# Patient Record
Sex: Male | Born: 1960
Health system: Southern US, Community
[De-identification: ages and names within clinical notes are randomized; demographics above are authoritative.]

## PROBLEM LIST (undated history)

## (undated) DIAGNOSIS — G4730 Sleep apnea, unspecified: Secondary | ICD-10-CM

## (undated) DIAGNOSIS — I2510 Atherosclerotic heart disease of native coronary artery without angina pectoris: Secondary | ICD-10-CM

## (undated) DIAGNOSIS — G2581 Restless legs syndrome: Secondary | ICD-10-CM

## (undated) DIAGNOSIS — K219 Gastro-esophageal reflux disease without esophagitis: Secondary | ICD-10-CM

## (undated) DIAGNOSIS — I255 Ischemic cardiomyopathy: Secondary | ICD-10-CM

## (undated) DIAGNOSIS — I219 Acute myocardial infarction, unspecified: Secondary | ICD-10-CM

## (undated) DIAGNOSIS — Z9289 Personal history of other medical treatment: Secondary | ICD-10-CM

## (undated) DIAGNOSIS — J9811 Atelectasis: Secondary | ICD-10-CM

## (undated) DIAGNOSIS — I10 Essential (primary) hypertension: Secondary | ICD-10-CM

## (undated) DIAGNOSIS — E7800 Pure hypercholesterolemia, unspecified: Secondary | ICD-10-CM

## (undated) DIAGNOSIS — N200 Calculus of kidney: Secondary | ICD-10-CM

## (undated) HISTORY — PX: CYSTOSCOPY: SUR368

## (undated) HISTORY — DX: Ischemic cardiomyopathy: I25.5

## (undated) HISTORY — DX: Personal history of other medical treatment: Z92.89

## (undated) HISTORY — PX: CHOLECYSTECTOMY: SHX55

---

## 1989-07-14 HISTORY — PX: CHEST TUBE INSERTION: SHX231

## 2001-01-08 ENCOUNTER — Encounter: Payer: Self-pay | Admitting: Thoracic Surgery

## 2001-01-08 ENCOUNTER — Encounter: Admission: RE | Admit: 2001-01-08 | Discharge: 2001-01-08 | Payer: Self-pay | Admitting: Thoracic Surgery

## 2001-01-12 ENCOUNTER — Ambulatory Visit (HOSPITAL_COMMUNITY): Admission: RE | Admit: 2001-01-12 | Discharge: 2001-01-12 | Payer: Self-pay | Admitting: Internal Medicine

## 2001-02-01 ENCOUNTER — Encounter: Payer: Self-pay | Admitting: Internal Medicine

## 2001-02-01 ENCOUNTER — Ambulatory Visit (HOSPITAL_COMMUNITY): Admission: RE | Admit: 2001-02-01 | Discharge: 2001-02-01 | Payer: Self-pay | Admitting: Internal Medicine

## 2001-10-23 ENCOUNTER — Emergency Department (HOSPITAL_COMMUNITY): Admission: EM | Admit: 2001-10-23 | Discharge: 2001-10-23 | Payer: Self-pay | Admitting: Emergency Medicine

## 2001-12-15 ENCOUNTER — Encounter: Payer: Self-pay | Admitting: Internal Medicine

## 2001-12-15 ENCOUNTER — Ambulatory Visit (HOSPITAL_COMMUNITY): Admission: RE | Admit: 2001-12-15 | Discharge: 2001-12-15 | Payer: Self-pay | Admitting: Internal Medicine

## 2001-12-30 ENCOUNTER — Encounter: Payer: Self-pay | Admitting: Internal Medicine

## 2001-12-30 ENCOUNTER — Ambulatory Visit (HOSPITAL_COMMUNITY): Admission: RE | Admit: 2001-12-30 | Discharge: 2001-12-30 | Payer: Self-pay | Admitting: Internal Medicine

## 2002-01-25 ENCOUNTER — Encounter: Payer: Self-pay | Admitting: Urology

## 2002-01-25 ENCOUNTER — Ambulatory Visit (HOSPITAL_COMMUNITY): Admission: RE | Admit: 2002-01-25 | Discharge: 2002-01-25 | Payer: Self-pay | Admitting: Urology

## 2002-03-21 ENCOUNTER — Ambulatory Visit (HOSPITAL_COMMUNITY): Admission: RE | Admit: 2002-03-21 | Discharge: 2002-03-21 | Payer: Self-pay | Admitting: Internal Medicine

## 2002-03-21 ENCOUNTER — Encounter: Payer: Self-pay | Admitting: Internal Medicine

## 2002-04-15 ENCOUNTER — Ambulatory Visit (HOSPITAL_COMMUNITY): Admission: RE | Admit: 2002-04-15 | Discharge: 2002-04-15 | Payer: Self-pay | Admitting: Internal Medicine

## 2002-07-14 DIAGNOSIS — I219 Acute myocardial infarction, unspecified: Secondary | ICD-10-CM

## 2002-07-14 HISTORY — PX: CARDIAC CATHETERIZATION: SHX172

## 2002-07-14 HISTORY — DX: Acute myocardial infarction, unspecified: I21.9

## 2002-09-04 ENCOUNTER — Encounter: Payer: Self-pay | Admitting: Internal Medicine

## 2002-09-04 ENCOUNTER — Encounter: Payer: Self-pay | Admitting: *Deleted

## 2002-09-04 ENCOUNTER — Inpatient Hospital Stay (HOSPITAL_COMMUNITY): Admission: AD | Admit: 2002-09-04 | Discharge: 2002-09-07 | Payer: Self-pay | Admitting: *Deleted

## 2002-09-05 ENCOUNTER — Encounter: Payer: Self-pay | Admitting: *Deleted

## 2003-03-10 ENCOUNTER — Ambulatory Visit (HOSPITAL_COMMUNITY): Admission: RE | Admit: 2003-03-10 | Discharge: 2003-03-10 | Payer: Self-pay | Admitting: Internal Medicine

## 2003-03-10 ENCOUNTER — Encounter: Payer: Self-pay | Admitting: Internal Medicine

## 2004-08-05 ENCOUNTER — Ambulatory Visit (HOSPITAL_COMMUNITY): Admission: RE | Admit: 2004-08-05 | Discharge: 2004-08-05 | Payer: Self-pay | Admitting: Internal Medicine

## 2006-10-13 ENCOUNTER — Encounter (INDEPENDENT_AMBULATORY_CARE_PROVIDER_SITE_OTHER): Payer: Self-pay | Admitting: Specialist

## 2006-10-13 ENCOUNTER — Ambulatory Visit: Payer: Self-pay | Admitting: Internal Medicine

## 2006-10-13 ENCOUNTER — Inpatient Hospital Stay (HOSPITAL_COMMUNITY): Admission: EM | Admit: 2006-10-13 | Discharge: 2006-10-13 | Payer: Self-pay | Admitting: Emergency Medicine

## 2006-12-11 ENCOUNTER — Ambulatory Visit: Payer: Self-pay | Admitting: Internal Medicine

## 2007-06-22 ENCOUNTER — Ambulatory Visit: Payer: Self-pay | Admitting: Internal Medicine

## 2007-09-12 DIAGNOSIS — Z9289 Personal history of other medical treatment: Secondary | ICD-10-CM

## 2007-09-12 HISTORY — DX: Personal history of other medical treatment: Z92.89

## 2008-03-06 IMAGING — CT CT PELVIS W/ CM
2 of 5 series · 15 of 46 positions shown, 17 images · IV contrast (Omnipaque 300)
Comparison: Noncontrast study performed on 01/25/02.

CLINICAL DATA: Abdominal and pelvic pain.  History of cholecystectomy and renal calculi. 
ABDOMEN CT WITH CONTRAST:
TECHNIQUE: Multidetector CT imaging of the abdomen was performed following the standard protocol during bolus administration of intravenous contrast.
Contrast:  Oral contrast and 100 cc Omnipaque 300
TECHNIQUE: Multidetector CT imaging of the pelvis was performed following the standard protocol during bolus administration of intravenous contrast.

[Series 3: mpr coronal a/p · coronal · 0.61mm/px · 3 of 73 slices shown]
[im 25/73  soft-tissue]
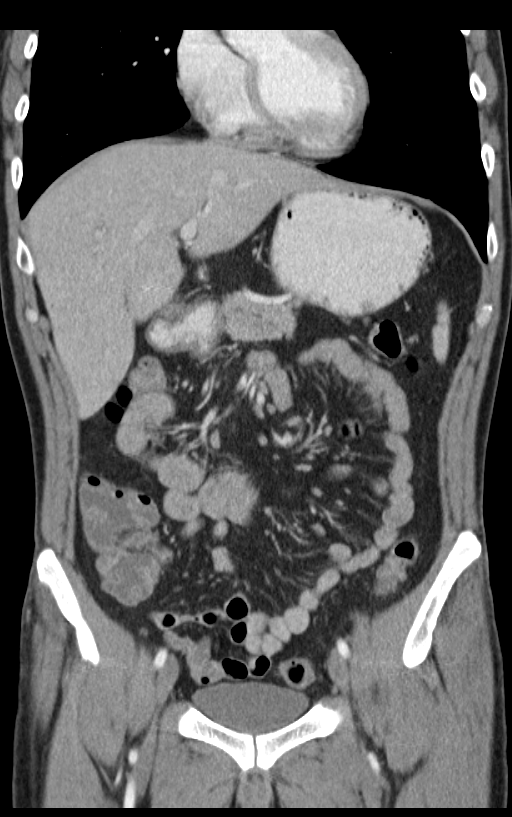
[im 33/73  soft-tissue]
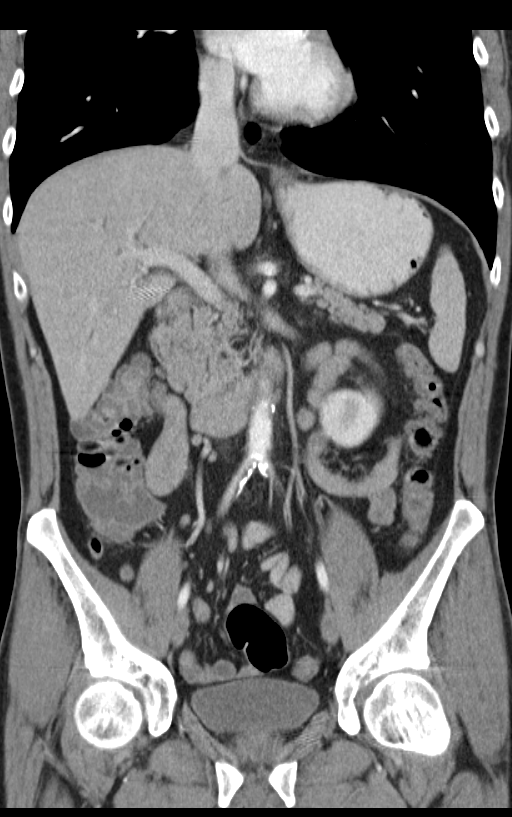
[im 41/73  soft-tissue]
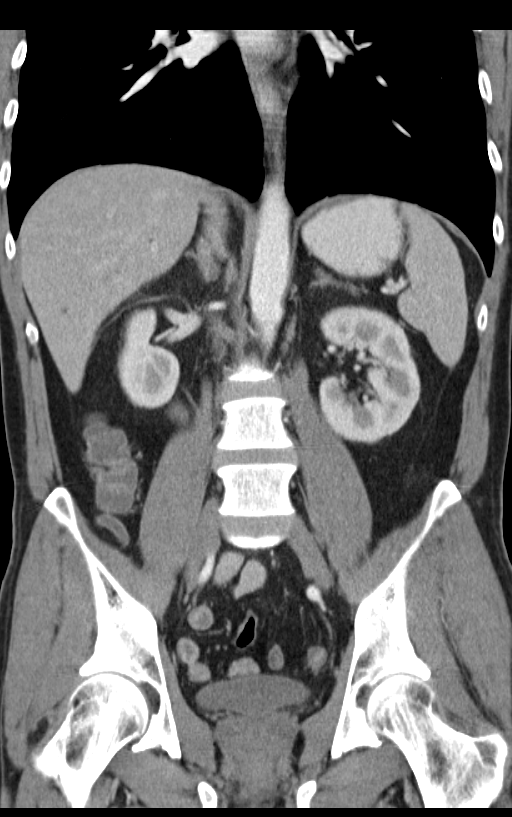

[Series 5: abd_pel 5.0 b40f · axial · 0.64mm/px · z∈[-531,-111]mm · 12 of 96 slices shown, 14 images]
[im 6/96  soft-tissue]
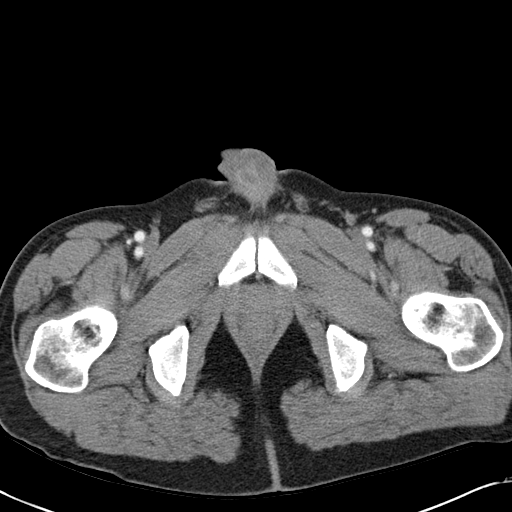
[im 6/96  bone]
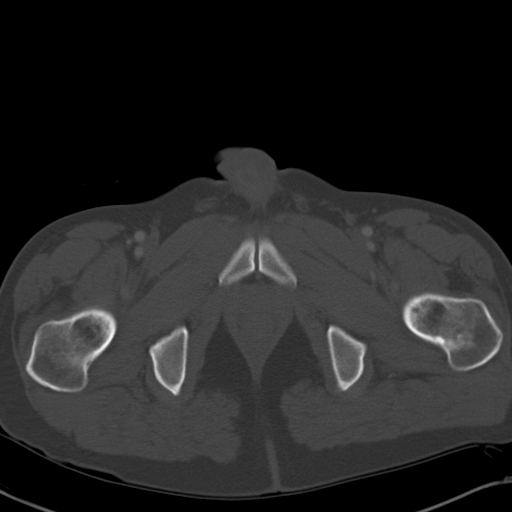
[im 17/96  soft-tissue]
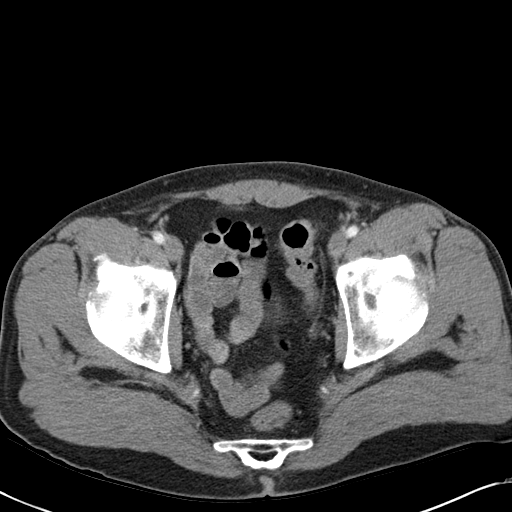
[im 23/96  soft-tissue]
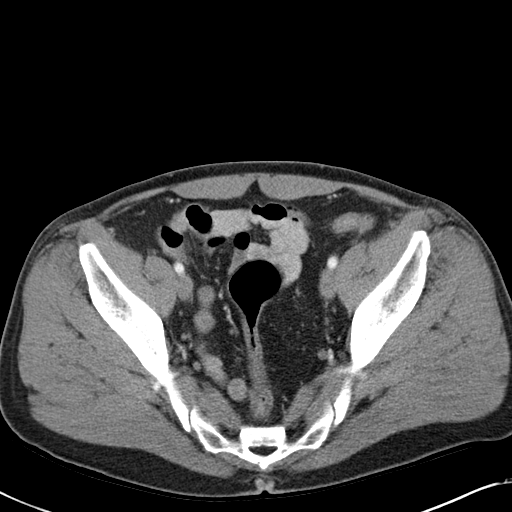
[im 28/96  soft-tissue]
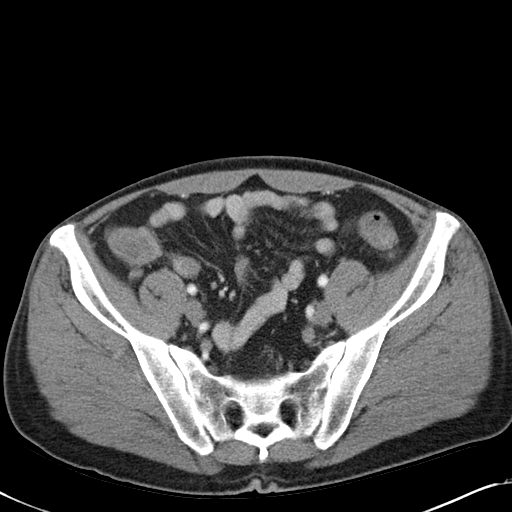
[im 40/96  soft-tissue]
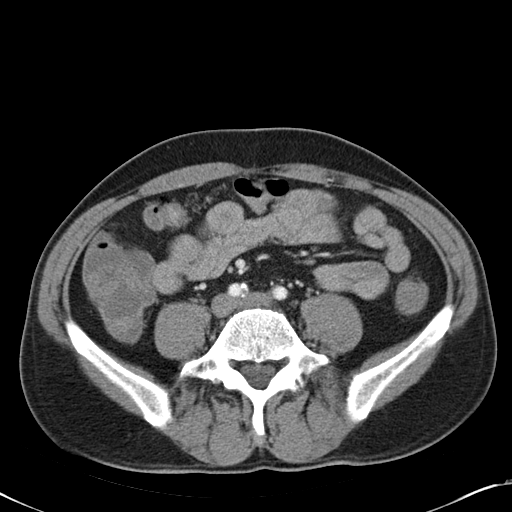
[im 45/96  soft-tissue]
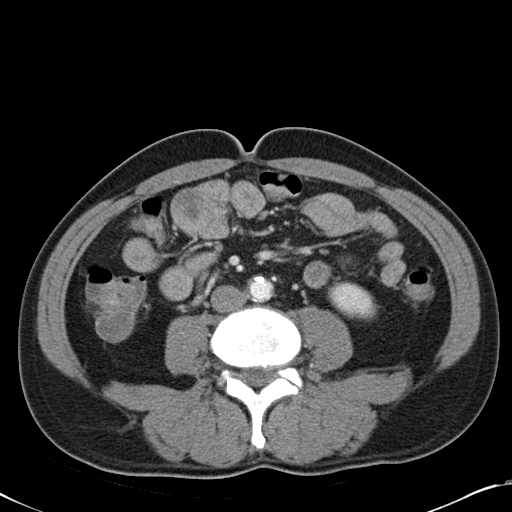
[im 51/96  soft-tissue]
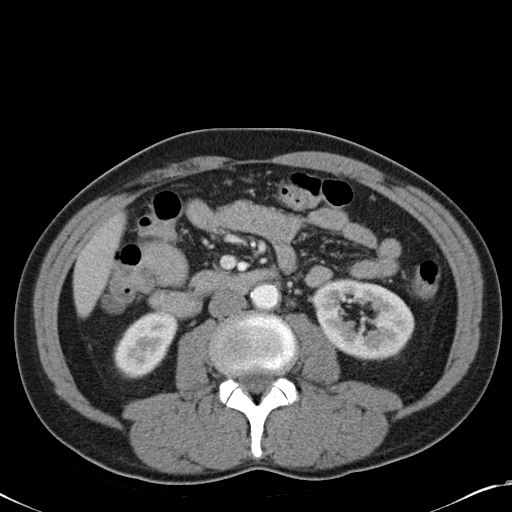
[im 62/96  soft-tissue]
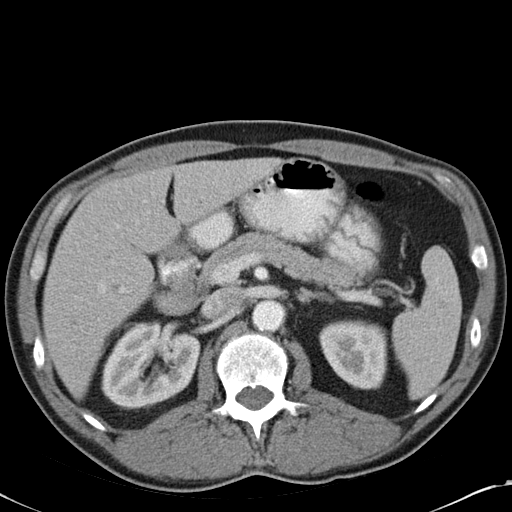
[im 68/96  soft-tissue]
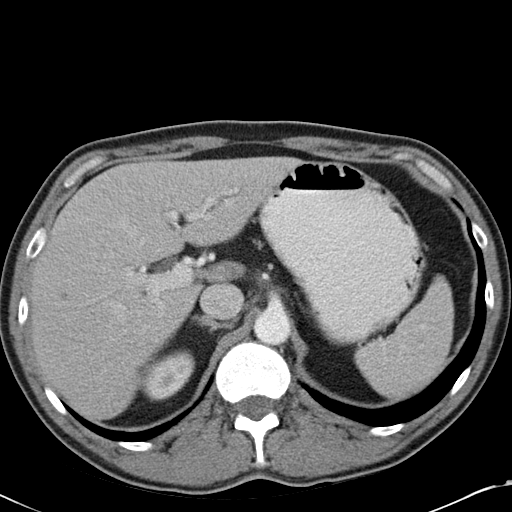
[im 68/96  bone]
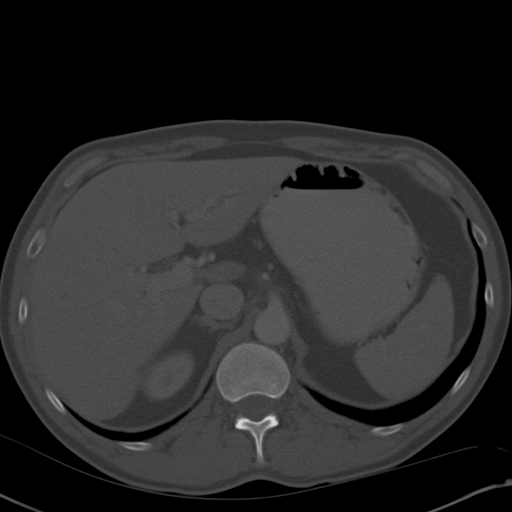
[im 73/96  soft-tissue]
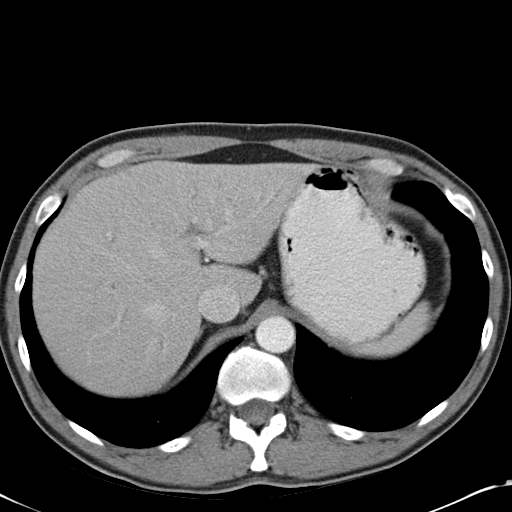
[im 84/96  soft-tissue]
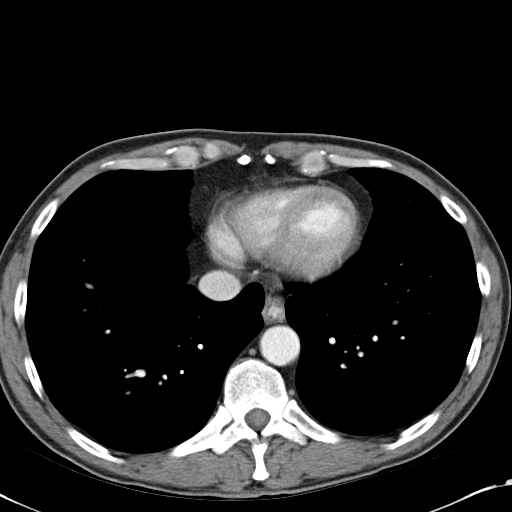
[im 90/96  soft-tissue]
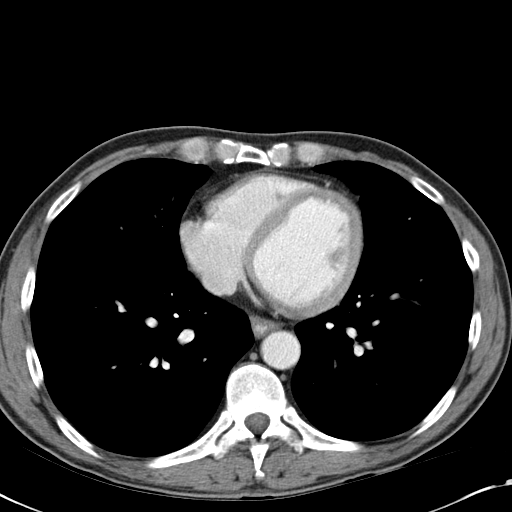

[15 of 46 positions shown; findings below may reference images not displayed]

FINDINGS: The liver, spleen, adrenal glands, kidneys, and pancreas are unremarkable.  No free fluid, enlarged lymph nodes, or abdominal aortic aneurysm.  There is a mildly distended stomach noted and only a small amount of contrast extends into the duodenal bulb and gastric outlet obstruction or gastroparesis not excluded.  Small hiatal hernia and reflux noted.  Remainder of the visualized bowel is unremarkable.
IMPRESSION: 1.  Distended stomach with contrast witha small amount of contrast in the duodenal bulb and no distal oral contrast -cannot exclude gastric outlet obstruction or gastroparesis.  Small hiatal hernia with gastroesophageal reflux. 
2.  Status-post cholecystectomy. 
PELVIS CT WITH CONTRAST:
FINDINGS: The visualized bowel, bladder, and appendix are unremarkable.  No free fluid, enlarged lymph nodes, or abdominal aortic aneurysm.  Visualized bowel is unremarkable.
IMPRESSION: No acute abnormality within the pelvis.

## 2010-11-26 NOTE — Assessment & Plan Note (Signed)
Rodney Moore, Rodney Moore                 CHART#:  [id number]   DATE:  12/11/2006                       DOB:  [birth year]   SUBJECTIVE:  Follow up right upper quadrant abdominal pain, nausea,  vomiting and diarrhea.  This gentleman has a long history of right upper  quadrant abdominal pain.  He was hospitalized with an acute illness,  characterized by an exacerbation of right upper quadrant abdominal pain,  nausea, vomiting and diarrhea.  Last month the patient was seen in  consultation by Dr. Dionicia Abler.  Labs and a CT were unremarkable.  There was  a question of a gastric outlet obstruction.  He underwent an EGD.  He  did not have a gastric outlet obstruction.  Questionable changes of the  small bowel mucosa which was biopsied and there was some blunting of the  villa.  He had a celiac pattern which was negative.  A serum IgA level  was also normal, making celiac disease highly unlikely.   He said that ever since he left the hospital, his Omeprazole was bumped  up to 20 mg orally b.i.d. and the right upper quadrant abdominal pain  and reflux symptoms have subsided.  He has a bowel movement after each  meal.  He is not passing any blood.  He was evaluated extensively back  in 2003, and 2004, through this office by me and was seen by Dr. Kennon Rounds over at [facility].  He was ultimately felt to have  gastroesophageal reflux disease and IBS.  I note that he has gained 13  pounds since he was seen in 2003.  His gallbladder is long gone.  He has  a history of a pneumothorax.   CURRENT MEDICATIONS:  See the updated list.   ALLERGIES:  PENICILLIN.   PHYSICAL EXAMINATION:  GENERAL:  He appears his chronic baseline.  VITAL SIGNS:  Weight 170 pounds, height 6 feet 2 inches, temperature  97.7 degrees, blood pressure 118/80, pulse 72.  CHEST:  Lungs are clear to auscultation.  There is no chest wall  tenderness.  HEART:  A regular rate and rhythm without murmur, gallop or rub.  ABDOMEN:  Flat  with positive bowel sounds, entirely nontender to deep  palpation.  No appreciable mass or organomegaly. There is no CVA  tenderness.   ASSESSMENT:  Right intermittent right upper quadrant abdominal pain,  recent hospitalization:  Likely more related to a viral gastroenteritis  than anything else.  Clinically he is doing better, at least  temporarily, associated with bumping up his Omeprazole to 20 mg orally  b.i.d.  I had a lengthy discussion with him today about the chronic  nature of his symptoms and the fact that he has been seen by multiple  physicians and has had multiple studies over time, and no significant  diagnosis has been missed.  I feel that his pain is real; however,  whether it is related to some occult biliary spasm or adhesions or even  chest wall pain remains to be conclusively proven.  At this point in  time though he is doing as well as he has done in the past several  years.   RECOMMENDATIONS:  1. Continue Omeprazole 20 mg orally b.i.d.  No further GI evaluation      is warranted at this  time.  2. I will plan to see this nice gentleman back in the office in six      months.       Jonathon Bellows, M.D.  Electronically Signed     RMR/MEDQ  D:  12/11/2006  T:  12/11/2006  Job:  [id number]   cc:   Kingsley Callander. Ouida Sills, MD

## 2010-11-26 NOTE — Assessment & Plan Note (Signed)
Rodney Moore, Rodney Moore                 CHART#:  [id number]   DATE:  06/22/2007                       DOB:  1961-03-23   Followup right upper quadrant pressure, history of erosive reflux  esophagitis, intermittent nausea and vomiting and occasional diarrhea.  The patient has had the above mentioned symptoms for years and has been  evaluated extensively here and over at [facility].  He has erosive  reflux esophagitis and irritable bowel syndrome.  He tells me since his  omeprazole was bumped up to 20 mg orally b.i.d. all the above mentioned  symptoms have settled down.  He has stopped smoking finally, he has  gained 13 pounds since we last saw him on Nov 14, 2006.  He describes  right upper quadrant discomfort as a pressure, but it is really not  giving him the problems that it used to.  He is able to eat and go on  about his business.  He is now on Lexapro 10 mg orally, orchestrated by  Dr. Ouida Sills, he takes his evening dose of omeprazole at bedtime, not  having any odynophagia, early satiety, nausea or vomiting.  There has  been no melena or rectal bleeding.  He had a negative colonoscopy in  2003 by me, his gallbladder is out.  There is no family history of GI  neoplasia and a prior EGD by Dr. __________  demonstrated erosive reflux  esophagitis.   CURRENT MEDICATIONS:  See updated list.   ALLERGIES:  PENICILLIN.   EXAMINATION:  Looks well, weight 183, height is 6 foot 2, temperature  98.1, blood pressure 118/82, pulse 64.  SKIN:  Warm and dry.  HEENT:  No __________  icterus.  CHEST:  Lungs are clear to auscultation.  HEART:  Regular rate and rhythm without murmur, gallop or rub.  ABDOMEN:  Nondistended, positive bowel sounds, soft, nontender,  __________ .   ASSESSMENT:  1. Right upper quadrant pressure/history of erosive reflux esophagitis      who are probably somewhat related.  At any rate he is doing very      well now on omeprazole 20 mg orally b.i.d.  I have asked him  to      take his evening dose of omeprazole before his evening meal and not      afterwards, this will increase efficacy.  2. He has gained 13 pounds since he stopped smoking.  This is actually      reassuring from a gastrointestinal standpoint but I have asked him      not to gain anymore as this will undermine our efforts combating      his reflux disease.  3. Irritable bowel syndrome, more or less quiescent, no further      evaluation needed at this time.  I do recommend that he ought to      have a repeat colonoscopy in 2013 for screening purposes.  4. If he requires longterm b.i.d. proton pump inhibitor therapy given      the problems that he has      had with his back, etc., over time I would talk about a baseline      bone density study at age 50.  Unless something comes up, plan to      see this nice gentleman back in 1 year.  Jonathon Bellows, M.D.  Electronically Signed     RMR/MEDQ  D:  06/22/2007  T:  06/22/2007  Job:  [id number]   cc:   Kingsley Callander. Ouida Sills, MD

## 2010-11-29 NOTE — Op Note (Signed)
NAMELES, LONGMORE                ACCOUNT NO.:  [id number]   MEDICAL RECORD NO.:  [id number]          PATIENT TYPE:  INP   LOCATION:  A331                          FACILITY:  [facility]   PHYSICIAN:  Lionel December, M.D.    DATE OF BIRTH:  28-Jan-1961   DATE OF PROCEDURE:  10/13/2006  DATE OF DISCHARGE:                               OPERATIVE REPORT   PROCEDURE:  Esophagogastroduodenoscopy.   INDICATIONS:  Rodney Moore is a 50 year old Caucasian male with chronic right  upper quadrant pain with negative workup in the past who is admitted  with worsening epigastric and right upper quadrant pain associated with  nausea, vomiting and diarrhea.  He had abdominopelvic CT early this  morning which revealed most of the contrast was in the stomach which may  be somewhat dilated.  I talked with the patient and found that he had  finishing drinking contrast two hours before the study.  He is,  therefore, undergoing diagnostic EGD to make sure he does not have  gastric outlet obstruction or pyloric stenosis.  The procedure risks  were reviewed with the patient and informed consent was obtained.   MEDS FOR CONSCIOUS SEDATION:  Benzocaine spray for oropharyngeal topical  anesthesia, Demerol 50 mg IV, Versed 6 mg IV.   FINDINGS:  The procedure was performed in the endoscopy suite.  The  patient's vital signs and O2 sat were monitored during the procedure and  remained stable.  The patient was placed in the left lateral position  and the Pentax videoscope was passed via oropharynx without any  difficulty into the esophagus.   Esophagus:  The mucosa of the proximal and middle third was normal.  Distally, there were three linear erosions extending into the GE  junction.  The longest one was about 1 cm.  The GE junction was at 40 cm  from the incisors and hiatus at 43.  The mucosa of the herniated part of  the stomach was normal.   The stomach was empty and distended very well with insufflation.  The  folds  of the proximal stomach were normal.  Examination of the mucosa at  body, antrum, pyloric channel as well as angularis, fundus and cardia  was normal.   Duodenal and bulbar mucosa was normal.  The scope was passed to the  second and third part of the duodenum.  The mucosa had a coarse  appearance.  Biopsy was taken to make sure he does not have villous  atrophy.  The endoscope was withdrawn.  The patient tolerated the  procedure well.   FINAL DIAGNOSIS:  1. Erosive reflux esophagitis and small sliding hiatal hernia.  No      evidence of peptic ulcer disease or pyloric stenosis.  2. Questionable abnormality to post bulbar duodenum mucosa, biopsy was      taken to rule out villous atrophy, etc.   RECOMMENDATIONS:  1. Increase PPI to t.i.d. for eight weeks and thereafter daily.  2. Will advance his diet to 4 grams sodium heart healthy diet.  3. We will follow up on the biopsy results.  Lionel December, M.D.  Electronically Signed     NR/MEDQ  D:  10/13/2006  T:  10/13/2006  Job:  [id number]   cc:   Kingsley Callander. Ouida Sills, MD  Fax: [phone]

## 2010-11-29 NOTE — Consult Note (Signed)
Rodney Moore, Rodney Moore                ACCOUNT NO.:  [id number]   MEDICAL RECORD NO.:  [id number]          PATIENT TYPE:  INP   LOCATION:  A331                          FACILITY:  [facility]   PHYSICIAN:  Lionel December, M.D.    DATE OF BIRTH:  18-Jan-1961   DATE OF CONSULTATION:  10/13/2006  DATE OF DISCHARGE:                                 CONSULTATION   REASON FOR CONSULTATION:  Gastroenteritis, questionable gastric outlet  obstruction and CT.   REQUESTING PHYSICIAN:  Kingsley Callander. Ouida Sills, MD   HISTORY OF PRESENT ILLNESS:  Rodney Moore is a 50 year old Caucasian gentleman  with history of IBS and chronic right upper quadrant abdominal pain.  He  presented to the emergency department early this morning with complaints  of nausea, vomiting, diarrhea and right upper quadrant abdominal pain.  He states that he was doing very well, went to breakfast Saturday  morning; had bacon, eggs, and toast.  By the time he got home he  developed a profuse watery diarrhea.  Later that day he started having  vomiting.  Every time he would try to eat he would have vomiting and  right upper quadrant abdominal pain and his right upper quadrant  abdominal pain was also intensified.  He denies any hematemesis, melena,  or rectal bleeding.  He states that he is here in no diarrhea since he  has been in the emergency department this morning.  He reportedly had a  CT in the abdomen and pelvis which reported a questionable gastric  outlet obstruction versus gastroparesis, but I have not seen the report.  He states that is the acid reflux symptoms are controlled on omeprazole.  Denies any ill contacts.  He says that he did have fever at home. This  tended to worsen if he tried to eat.  T-max was one a 101.5.  He has  been afebrile since he has been admitted.   The patient has been extensively evaluated back in 2002 and 2003 by Dr.  Jena Gauss.  He had an EGD which revealed erosive enteritis.  CLOtest was  negative.  He had a  colonoscopy which revealed internal hemorrhoids and  a normal terminal ileum.  Small bowel follow-through was normal at that  time as well.  CTs were unremarkable.  He had MRI of his thoracic and  lumbar region, but no identifiable cause for his chronic right upper  quadrant abdominal pain.  He eventually was sent to [facility] and seen by Dr. Romeo Apple __________  who also felt that he  had IBS.  He had an MRI of the abdomen which was negative as well.   The patient states that his chronic right upper quadrant abdominal pain  began after he had chest tube placement around 2002.   MEDICATIONS AT HOME:  1. Plavix 75 mg daily.  2. Aspirin 81 mg daily.  3. Vytorin 10/80 mg q.h.s.  4. Metoprolol 25 mg b.i.d.  5. Lexapro 10 mg daily.  6. Levsin sublingual q.a.c.  7. Lisinopril 10 mg daily.  8. Omeprazole 20 mg daily.   ALLERGIES:  PENICILLIN causes rash.   PAST MEDICAL HISTORY:  1. History of coronary artery disease status post stent.  2. Chronic back pain.  3. History of right lung collapse status post chest tube.  4. Previous EGD and colonoscopy as outlined above.  5. History of IBS.  6. History of nephrolithiasis bilaterally; had a lithotripsy on the      right side previously.   FAMILY HISTORY:  Mother recently had an EGD; and patient states that she  had peptic ulcer disease.  No family history of colorectal cancer.  He  states his father has had a history of colitis, details unavailable.   SOCIAL HISTORY:  He is married.  He is unemployed.  He has a grown  daughter with his first wife.  He occasionally consumes alcohol.  He  smokes one pack of cigarettes daily.   REVIEW OF SYSTEMS:  See HPI for GI.  CONSTITUTIONAL:  No unintentional  weight loss.  CARDIOPULMONARY:  No chest pain, shortness of breath, or  palpitations.  GENITOURINARY:  Denies any gross hematuria or dysuria.   PHYSICAL EXAMINATION:  VITAL SIGNS:  Temperature 97.4, pulse 69,  respirations  18, and blood pressure 107/62.  Weight 71.1 kg.  GENERAL:  A pleasant, well-nourished, well-developed, Caucasian male in  no acute distress.  SKIN:  Warm and dry.  No jaundice.  HEENT:  Sclerae anicteric.  Oropharyngeal mucosa moist and pink.  No  lesions, erythema, or exudate.  No lymphadenopathy or thyromegaly.  CHEST:  Lungs are clear to auscultation.  CARDIAC EXAM:  Reveals regular rate and rhythm.  Normal S1-S2.  No  murmurs, rubs, or gallops.  ABDOMEN:  Positive bowel sounds, soft, nondistended, and nontender.  No  organomegaly or masses.  No rebound tenderness or guarding.  No  abdominal bruits or hernias.  No succussion splash.  EXTREMITIES:  No edema.   LABS:  White count 5600, hemoglobin 15.4, platelets 187,000.  Sodium  138, potassium 3.5, BUN 14, creatinine 0.8, glucose 117, total bilirubin  0.6, alkaline phosphatase 64, AST 28, ALT 28, albumin 3.9, lipase 18.  Urinalysis revealed large blood.   IMPRESSION:  The patient is a 50 year old gentleman with acute on  chronic right upper quadrant/epigastric abdominal pain with an acute  onset of nausea, vomiting, and diarrhea.  He has trio irritable bowel  syndrome.  Reportedly the CT suggested gastric outlet obstruction.  His  abdominal exam is benign this morning.  He is tolerating clear liquids.  We will need to review the CT to make further recommendations.   RECOMMENDATIONS:  1. Review CT.  He may need to have EGD to rule out gastric outlet      obstruction if this is suggested on the current study.  2. Continue PPI therapy.  3. Will make him NPO for now.  4. Will discuss further with Dr. Karilyn Cota.  I would like to thank Dr.      Carylon Perches for allowing [place] to take part in the care of this patient.      Tana Coast, P.A.      Lionel December, M.D.  Electronically Signed    LL/MEDQ  D:  10/13/2006  T:  10/13/2006  Job:  [id number]   cc:   Kingsley Callander. Ouida Sills, MD  Fax: [phone]

## 2010-11-29 NOTE — Discharge Summary (Signed)
NAMESTEFAN, [name]                ACCOUNT NO.:  [id number]   MEDICAL RECORD NO.:  [id number]          PATIENT TYPE:  INP   LOCATION:  A331                          FACILITY:  [facility]   PHYSICIAN:  Kingsley Callander. Ouida Sills, MD       DATE OF BIRTH:  1961-03-13   DATE OF ADMISSION:  10/12/2006  DATE OF DISCHARGE:  04/01/2008LH                               DISCHARGE SUMMARY   DISCHARGE DIAGNOSES:  1. Acute gastroenteritis.  2. Erosive reflux esophagitis with a small hiatal hernia.  3. Irritable bowel syndrome.  4. Chronic duodenitis by biopsy.  5. Coronary artery disease.  6. Probable irritable bowel syndrome.  7. History of kidney stones.  8. Restless leg syndrome.  9. Hyperlipidemia.   DISCHARGE MEDICATIONS:  1. Omeprazole 20 mg twice daily.  2. Lexapro 10 mg daily.  3. Lisinopril 10 mg daily.  4. Metoprolol 25 mg twice daily.  5. Plavix 75 mg daily.  6. Aspirin 81 mg daily.  7. Vytorin 10/80 mg daily.  8. Hyoscyamine 0.125 mg t.i.d.  9. Compazine 10 mg q.6 h. p.r.n.   PROCEDURES:  1. EGD.  2. Abdominal CT scan.   HOSPITAL COURSE:  This patient is a 50 year old male who presented to  the emergency room with a 1-day history of vomiting.  He had experienced  diarrhea prior to that.  He had not experienced bleeding.  He was  evaluated in the emergency room and was found to have a possible gastric  outlet obstruction by CT.  His exam, though, was not really consistent  with that in that he did not have abdominal distention.  He was  hospitalized and treated with IV fluids and analgesics.  A GI  consultation was obtained with Dr. Karilyn Cota.  An EGD was performed, which  revealed erosive reflux esophagitis with a small hiatal hernia.  There  was no evidence of peptic ulcer disease or pyloric stenosis.  There was  a questionable abnormality of the postbulbar mucosa.  A biopsy was  taken, which revealed chronic duodenitis.  His PPI was increased to  b.i.d.   His symptoms improved and he was  stable for discharge on the afternoon  of the 1st.  He has previously had cholecystectomy.  His white count was  normal at 5.6.  BUN and creatinine were normal at 14 and 0.8.  LFTs were  normal.  His urinalysis was abnormal with 0-2 white cells, 3-6 red  cells, rare bacteria, trace protein, trace ketones and large hemoglobin.  A urine culture, though, was negative.  His kidneys were normal by CT.  This will be followed up as an outpatient.  He does have a history of  kidney stones.   He is stable from a coronary artery disease standpoint.   He has had chronic right lateral abdominal pain of unclear etiology.  This was previously evaluated at [facility], and he was felt to have IBS.   FOLLOWUP:  The patient will be seen in the office in 2 weeks.      Kingsley Callander. Ouida Sills, MD  Electronically Signed  ROF/MEDQ  D:  10/25/2006  T:  10/25/2006  Job:  [id number]   cc:   Lionel December, M.D.  [address]  [place]  [place] [address]

## 2010-11-29 NOTE — H&P (Signed)
NAMEDAMIN, SALIDO                ACCOUNT NO.:  [id number]   MEDICAL RECORD NO.:  [id number]          PATIENT TYPE:  INP   LOCATION:  A331                          FACILITY:  [facility]   PHYSICIAN:  Kingsley Callander. Ouida Sills, MD       DATE OF BIRTH:  [birth year]   DATE OF ADMISSION:  10/12/2006  DATE OF DISCHARGE:  04/01/2008LH                              HISTORY & PHYSICAL   CHIEF COMPLAINT:  Vomiting.   HISTORY OF PRESENT ILLNESS:  This patient is a 50 year old white male  who presented to the emergency room with a 1-day history of vomiting and  began having diarrhea 1 day prior to that.  He denied rectal bleeding,  melena or hematemesis.  He was worked up in the emergency room and was  found have a possible gastric outlet obstruction by CT.  His exam though  was really not consistent with it.  He had unresolved upper abdominal  pain, especially on the right side.  He required Dilaudid.  He was  hospitalized for additional evaluation and treatment.  He has a history  of irritable bowel syndrome.  He had previously had an EGD in 2002 and a  colonoscopy in 2003.  He has even been evaluated at [facility] and was felt  to have IBS.  He had been seen on March 5 and was thought to likely have  IBS and was treated with NuLev.  He is status post cholecystectomy.  He  has a history of kidney stones, but symptoms have not recently been  consistent with renal colic.  He has been on chronic acid suppression  with omeprazole.  His prior EGD revealed some erosive changes.   PAST MEDICAL HISTORY:  1. MI, February 2004.  2. Hyperlipidemia.  3. Irritable bowel syndrome.  4. Kidney stones.  5. Restless leg syndrome.  6. GERD.   MEDICATIONS:  1. Lisinopril 10 mg daily.  2. Metoprolol 25 mg b.i.d.  3. Plavix 75 mg daily.  4. Aspirin 81 mg daily.  5. Vytorin 10/80 mg daily.  6. NuLev t.i.d.  7. Omeprazole 20 mg daily.   ALLERGIES:  PENICILLIN.   SOCIAL HISTORY:  He is a former smoker; he does not smoke now.   He does  not use alcohol or recreational drugs.   FAMILY HISTORY:  His mother had a stroke.   REVIEW OF SYSTEMS:  Noncontributory.   PHYSICAL EXAM:  VITAL SIGNS:  Temperature 97.6, blood pressure 139/75,  pulse 78, respirations 18.  GENERAL:  Alert and oriented.  HEENT:  No scleral icterus.  The oropharynx is unremarkable.  NECK:  Supple without JVD or thyromegaly.  LUNGS:  Clear.  HEART:  Regular with no murmurs.  ABDOMEN:  Soft, nontender.  No hepatosplenomegaly or palpable mass.  EXTREMITIES:  Normal pulses, no edema.  NEUROLOGIC:  Intact.  LYMPH NODES:  No enlargement.  SKIN:  Unremarkable.   LABORATORY AND ACCESSORY CLINICAL DATA:  White count 5.6, hemoglobin  15.4, platelets 187,000.  Sodium 138, potassium 3.5, bicarb 23, glucose  117, creatinine 0.8, BUN 14, calcium 9.1, SGOT 28, SGPT 28,  albumin 3.9.  Urinalysis revealed 0-2 white cells, 3-6 red cells and rare bacteria  with trace ketones, large blood and trace protein.   His CT scan of the abdomen also reveals a normal liver and biliary  tract.   IMPRESSION:  1. Recurrent right upper quadrant pain with nausea and vomiting.  We      will treat empirically with intravenous Protonix.  He will be      treated with Zofran as needed.  We will obtain a Gastroenterology      consult for consideration of upper endoscopy and additional      evaluation.  His exam is really not consistent with a gastric      outlet-type picture.  2. Coronary artery disease, status post percutaneous transluminal      coronary angioplasty and Cypher stent placement to the left      anterior descending with post-myocardial-infarction left      ventricular dysfunction revealing an ejection fraction of 35% to      40% initially.  A followup echocardiogram revealed a normal      ejection fraction.  3. Hyperlipidemia.  4. Irritable bowel syndrome.  5. Status post cholecystectomy.      Kingsley Callander. Ouida Sills, MD  Electronically Signed     ROF/MEDQ   D:  10/13/2006  T:  10/14/2006  Job:  [id number]

## 2010-11-29 NOTE — Discharge Summary (Signed)
   NAME:  JAISON, PETRAGLIA NO.:  [id number]   MEDICAL RECORD NO.:  [id number]                   PATIENT TYPE:  INP   LOCATION:  3730                                 FACILITY:  [facility]   PHYSICIAN:  Darlin Priestly, M.D.             DATE OF BIRTH:  [birth year]   DATE OF ADMISSION:  09/04/2002  DATE OF DISCHARGE:  09/07/2002                                 DISCHARGE SUMMARY   ADDENDUM   The patient is on irritable bowel medications at home and will continue  these which are Aciphex 20 mg a day, Lexapro 10 mg a day and Hycomine p.r.n.  I did stop his Protonix as he is already on Aciphex.     Abelino Derrick, P.A.                      Darlin Priestly, M.D.    Lenard Lance  D:  09/07/2002  T:  09/07/2002  Job:  [id number]

## 2010-11-29 NOTE — Cardiovascular Report (Signed)
NAME:  Rodney Moore, Rodney Moore NO.:  [id number]   MEDICAL RECORD NO.:  [id number]                   PATIENT TYPE:  INP   LOCATION:  2920                                 FACILITY:  [facility]   PHYSICIAN:  Darlin Priestly, M.D.             DATE OF BIRTH:  [birth year]   DATE OF PROCEDURE:  09/04/2002  DATE OF DISCHARGE:                              CARDIAC CATHETERIZATION   PROCEDURES:  1. Left heart catheterization.  2. Coronary angiography.  3. Left ventriculogram.  4. Left anterior descending artery, mid.     A. AngioJet rheolytic therapy.     B. Intravascular ultrasound imaging.     C. Placement of intracoronary stent.     D. Percutaneous transluminal coronary balloon angioplasty.  5. Insertion of transvenous pacer wire.   ATTENDING PHYSICIAN:  Darlin Priestly, M.D.   COMPLICATIONS:  None.   INDICATIONS FOR PROCEDURE:  The patient is a 50 year old male patient of Dr.  Ouida Sills with a history of tobacco abuse, referred urgently to [facility] by Dr. Artis Delay with unstable angina and possible acute MI.  The  patient developed substernal chest pain which awoke him from sleep at 6:30  a.m. in the morning of September 04, 2002.  He subsequently presented to  [facility] ER where he was noted to have diffuse inferolateral ST segment  changes with a positive troponin.  Of note, the patient states he had been  having similar symptoms on and off for years, and had had his gallbladder  removed several years ago in an attempt to relieve his symptoms.  He is now  referred for a cardiac catheterization.   DESCRIPTION OF PROCEDURE:  After giving informed written consent, the  patient was brought to the cardiac catheterization lab where his right and  left groins were shaved, prepped, and draped in the usual sterile fashion.  ECG monitoring was established.  Using the modified Seldinger technique, a 7-  French arterial sheath was inserted in the right  femoral artery.  The 6-  French diagnostic catheters were used to perform diagnostic angiography.   FINDINGS:  1. This revealed a large left main with no significant disease.  2. The LAD is a large vessel which was totally occluded in its proximal     portion at the takeoff of the first septal perforator.  The area has     faint left-to-left collaterals to the distal LAD.  3. The left circumflex is a large vessel which coursed in the AV groove and     gave rise to 3 obtuse marginal branches.  The AV groove circumflex has no     significant disease.  The first OM is a large vessel which bifurcates     distally and has no significant disease.  The second and third obtuse     marginals are medium-sized vessels which bifurcate in the distal portion  and have no significant disease.  4. The right coronary artery is a medium-sized vessel which is dominant and     gives rise to the PDA.  There is some mild 40% proximal narrowing.  The     remainder of the RCA as well as PDA have no significant disease.  5. A left ventriculogram reveals mildly depressed EF of 35-40% with     anterior, apical, and inferoapical hypokinesis.   HEMODYNAMICS:  1. Systemic arterial pressure 104/73.  2. LV systemic pressure 96/3, LVEDP of 13.   INTERVENTIONAL PROCEDURE, LEFT ANTERIOR DESCENDING ARTERY:  Following  diagnostic angiography, a 6-French venous sheath was inserted into the right  femoral vein and a transvenous pacer wire was inserted into the RV apex, and  pacing thresholds were then determined.  Next, a 7-French JL4 guiding  catheter was then coaxially engaged in the left coronary ostium.  Following  this, a 0.014 short Forte guidewire was advanced through the guiding  catheter into the proximal LAD.  The guidewire was then used to successfully  cross the total occlusion and was placed in position in the distal LAD  without difficulty.  We did have it fully restored in the LAD within 30  minutes of  the patient's arrival to [facility].  We then passed a 4-French  AngioJet rheolytic catheter into the mid LAD, and 2 pullbacks were then  performed with removal of a visible thrombus.  This catheter was then  removed, and a Cordis Cypher 3.0 x 13-mm stent was then positioned in the  proximal LAD across the stenotic lesion.  The stent was then deployed to a  maximum of 18 atmospheres for a total of approximately 45 seconds.  Followup  angiogram revealed excellent luminal gain with no evidence of dissection or  thrombus, and TIMI-3 flow into the distal vessel.  Because of its proximity  in the LAD, we elected to IVUS the Cypher stent to ensure that it was fully  deployed in this young male.  The stent balloon was then removed, and a 40-  mHz IVUS imaging catheter was then advanced into the mid LAD.  Mechanical  pullback was then performed.  It revealed that the vessel diameter was  approximately 3.0 x 3.5 in its mid portion.  The stent in the mid portion of  the LAD appeared to be well-apposed in the distal aspect; however, pullback  through the more medial and proximal portion of the stent revealed the stent  was approximately 3.0 x 3.2, and the vessel diameter was closer to 4 to 4.2.  The IVUS imaging catheter was then removed, and a Quantum Ranger, 4.0 x 8-mm  balloon, was then tracked into the mid portion of the stent, and 2  subsequent inflations to 16 atmospheres were performed for approximately 1  minute and 20 seconds.  Followup angiogram revealed no evidence of  dissection or thrombus and TIMI-3 flow to the distal vessel.  IV Integrilin  was used throughout the case.  Intravenous doses of heparin were given to  maintain the ACT between 200 and 300.   Final orthogonal angiograms revealed less than 10% residual stenosis in the  proximal LAD stenotic lesion with TIMI-3 flow to the distal vessel.  At this point, we elected to conclude the procedure.  All balloons, wires, and  catheters  were removed.  The hemostatic sheath was sewn in place, and the  patient was transferred back to the ward in stable condition.   CONCLUSIONS:  1. Successful placement of a Cordis Cypher 3.0 x 13-mm stent in the proximal     left anterior descending artery stenotic lesion.  2. Successful post-dilation of the stent using a Quantum Ranger 4.0 x 8-mm     balloon.  3. Moderately depressed left ventricular systolic function with wall motion     abnormalities noted above.  4.     Temporary insertion of a transvenous pacer wire.  5. Successful AngioJet rheolytic treatment of the left anterior descending     artery.  6. Adjunct use of Integrilin infusion.                                               Darlin Priestly, M.D.    RHM/MEDQ  D:  09/04/2002  T:  09/04/2002  Job:  [id number]   cc:   Madelin Rear. Sherwood Gambler, M.D.  [address]  [place]  [place] [address]  Fax: [phone]   Kingsley Callander. Ouida Sills, M.D.  [address]  [place]  [place] [address]  Fax: [phone]

## 2010-11-29 NOTE — Op Note (Signed)
NAME:  Rodney Moore, Rodney Moore NO.:  [id number]   MEDICAL RECORD NO.:  [id number]                   PATIENT TYPE:  AMB   LOCATION:  DAY                                  FACILITY:  [facility]   PHYSICIAN:  Gerrit Friends. Rourk, M.D.               DATE OF BIRTH:  [birth year]   DATE OF PROCEDURE:  04/15/2002  DATE OF DISCHARGE:                                 OPERATIVE REPORT   PROCEDURE:  Diagnostic colonoscopy.   ENDOSCOPIST:  Gerrit Friends. Rourk, M.D.   INDICATIONS FOR PROCEDURE:  The patient is a 50 year old gentleman which a  right upper quadrant abdominal pressure since March of this year since he  has his gallbladder removed.  A prior EEG demonstrated only erosive  antritis.  CLOtest negative.  He has been extensively evaluated.  He also  has marked swings in bowel function from constipation having a bowel  movement every 3-4 days to multiple loose stools daily. He is felt to have  irritable bowel syndrome.  He was seen by Dr. Barbee Shropshire at [facility] and was extensively evaluated there.  They concurred with the  diagnosis of irritable bowel syndrome and recommended that he have a  colonoscopy to cover all the bases.  He has not had any rectal bleeding.  There is no family history of colorectal neoplasia.  Colonoscopy is now  being done to further evaluate his symptoms.  This approach has been  discussed with the patient previously and again at the bedside.  The  potential risks, benefits, and alternatives have been reviewed.  Please see  my handwritten H&P for more information.  I feel that he is low risk for  conscious sedation in the way of Versed and Demerol.   DESCRIPTION OF PROCEDURE:  O2 saturation, blood pressure, pulse and  respirations were monitored throughout the entire procedure.   CONSCIOUS SEDATION:  Versed 4 mg IV, Demerol 100 mg IV in divided doses.   INSTRUMENT:  Olympus video chip colonoscope.   FINDINGS:  A digital rectal exam  revealed no abnormalities.   ENDOSCOPIC FINDINGS:  The prep was good.   RECTUM:  Examination of the rectal mucosa including the retroflex view of  the anal verge revealed only internal hemorrhoids, minimal.   COLON:  The colonic mucosa was surveyed from the rectosigmoid junction  through the left transverse and right colon to the area of the appendiceal  orifice, ileocecal valve, and cecum.  These structures were well seen and  photographed for the record.  The colonic mucosa to the cecum appeared  normal.  The terminal ileum was intubated to 20 cm.  This segment of the GI  tract also appeared normal.   From this level the scope was slowly withdrawn.  All previously mentioned  mucosal surfaces were again seen; and, again, no abnormalities were  observed.  The patient tolerated the procedure well and  was reacted in  endoscopy.   IMPRESSION:  1. Minimal internal hemorrhoids, otherwise normal rectum.  2. Normal colon.  3. Normal terminal ileum.   DISCUSSION:  I feel that the patient does have irritable bowel syndrome.   I have explained this syndrome to the patient at some length and emphasized  the benign, but chronic nature of the syndrome.   RECOMMENDATIONS:  1. Daily fiber supplement in the way of Benefiber, Metamucil or Citrucel.  2. Librax tablets 1 a.c. and h.s. p.r.n. abdominal discomfort and diarrhea.  3. We will plan to see him back in 4-6 weeks for recheck.  Repeat colonoscopy in 10 years.                                               Gerrit Friends. Rourk, M.D.    RMR/MEDQ  D:  04/15/2002  T:  04/15/2002  Job:  [id number]   cc:   Kingsley Callander. Ouida Sills, M.D.

## 2010-11-29 NOTE — Discharge Summary (Signed)
NAME:  Rodney Moore, Rodney Moore NO.:  [id number]   MEDICAL RECORD NO.:  [id number]                   PATIENT TYPE:  INP   LOCATION:  3730                                 FACILITY:  [facility]   PHYSICIAN:  Darlin Priestly, M.D.             DATE OF BIRTH:  October 07, 1960   DATE OF ADMISSION:  09/04/2002  DATE OF DISCHARGE:  09/07/2002                                 DISCHARGE SUMMARY   HISTORY OF PRESENT ILLNESS:  The patient is a 50 year old male who presented  to [facility] ER on September 04, 2002, with chest pain consistent with  unstable angina.  EKG showed ST elevation of V1 through V3, ST depression in  leads I and III.  The patient was treated with aspirin, nitroglycerin,  heparin and transferred urgently to [facility] for further evaluation.  The  patient was seen by Dr. Jenne Campus and taken to the catheterization lab.  Catheterization revealed total LAD with faint left-to-left collaterals.  The  left main was normal.  The circumflex was normal.  RCA had a 40% proximal  narrowing.  EF was 35-40% with anterior apical and inferior apical  hypokinesis.   HOSPITAL COURSE:  The patient underwent angioplasty with placement of a  Cypher stent with good results.  He was put on Integrillin for 18 hours.  His CKs peaked at 23 and 34 with 300 MBs.  He was monitored in the intensive  care unit and was stable.  On February 24, he was transferred to telemetry  and ambulated.  ACE inhibitor, beta-blocker and Statins were added.  His  blood pressure was slightly borderline, but remained stable.  We felt he  could be discharged on September 07, 2002.  He will follow up with Dr.  Domingo Sep in [place], as he lives in [place], [place], and this  would be most convenient for him.   DISCHARGE MEDICATIONS:  1. Altace 2.5 mg a day.  2. Zocor 80 mg a day.  3. Plavix 75 mg a day.  4. Lopressor 25 mg b.i.d.  5. Nitroglycerin sublingual p.r.n.  6. Protonix 40 mg a day.   DISCHARGE LABORATORY DATA AND X-RAY FINDINGS:  Sodium 136, potassium 3.6,  BUN 10, creatinine 0.9.  White count 10.5, hemoglobin 13.7, hematocrit 39.1,  platelets 188.  INR 1.1.  CKs peaked as above with CK of 23, 34 and MB of  300.  Lipids showed a cholesterol of 235, triglycerides 137, LDL 175, HDL  233.  TSH was low at 0.271 and will need to be repeated as an outpatient.   Chest x-ray shows no active process.  EKG shows sinus rhythm with Q waves in  V2, V3 and V4.   CONDITION ON DISCHARGE:  The patient is discharged in stable condition.   FOLLOW UP:  Follow up with Dr. Domingo Sep in [place].    SPECIAL INSTRUCTIONS:  He has been instructed on smoking  cessation.  He is  not to work until he sees Dr. Domingo Sep.  He works as a Curator.  He will  need a followup TSH as an outpatient.      Abelino Derrick, P.A.                      Darlin Priestly, M.D.    Lenard Lance  D:  09/07/2002  T:  09/07/2002  Job:  [id number]   cc:   Kem Boroughs, M.D.  [phone] [address]  [place]  [place] [address]  Fax: [phone]

## 2012-03-03 ENCOUNTER — Telehealth: Payer: Self-pay

## 2012-03-03 ENCOUNTER — Encounter (HOSPITAL_COMMUNITY): Payer: Self-pay | Admitting: Pharmacy Technician

## 2012-03-03 DIAGNOSIS — Z139 Encounter for screening, unspecified: Secondary | ICD-10-CM

## 2012-03-03 NOTE — Telephone Encounter (Signed)
Pt called. He received letter from [place] to call and schedule colonoscopy. He was referred by Dr. Ouida Sills. He said he is not having any problems except some diarrhea but he has IBS. [place] Spaniel it is no worse than usual and he cannot afford a lot of appointments. His last one was with [facility] on 04/15/2002. Next recommended was 10 years. Called pt back to tell him when the last one was. Line is busy.

## 2012-03-04 ENCOUNTER — Other Ambulatory Visit: Payer: Self-pay

## 2012-03-04 DIAGNOSIS — Z139 Encounter for screening, unspecified: Secondary | ICD-10-CM

## 2012-03-04 NOTE — Telephone Encounter (Signed)
Gastroenterology Pre-Procedure Form    Request Date: 03/04/2012     Requesting Physician: Dr. Ouida Sills     PATIENT INFORMATION:  Rodney Moore is a 51 y.o., male (DOB=02-13-1961).  PROCEDURE: Procedure(s) requested: colonoscopy Procedure Reason: screening for colon cancer  PATIENT REVIEW QUESTIONS: The patient reports the following:   1. Diabetes Melitis: no 2. Joint replacements in the past 12 months: no 3. Major health problems in the past 3 months: no 4. Has an artificial valve or MVP:no 5. Has been advised in past to take antibiotics in advance of a procedure like teeth cleaning: no}    MEDICATIONS & ALLERGIES:    Patient reports the following regarding taking any blood thinners:   Plavix? no Aspirin?yes  Coumadin?  no  Patient confirms/reports the following medications:  Current Outpatient Prescriptions  Medication Sig Dispense Refill  . aspirin EC 81 MG tablet Take 81 mg by mouth daily.      . cetirizine (ZYRTEC) 10 MG tablet Take 10 mg by mouth daily.      . clonazePAM (KLONOPIN) 0.5 MG tablet Take 0.5 mg by mouth 1 day or 1 dose.      . fish oil-omega-3 fatty acids 1000 MG capsule Take 1 g by mouth daily.      [name] L-Lysine 500 MG CAPS Take 1 capsule by mouth daily.      [name] lisinopril (PRINIVIL,ZESTRIL) 10 MG tablet Take 10 mg by mouth daily.      . metoprolol (LOPRESSOR) 50 MG tablet Take 25 mg by mouth 2 (two) times daily.      . pantoprazole (PROTONIX) 40 MG tablet Take 40 mg by mouth daily.      . pravastatin (PRAVACHOL) 40 MG tablet Take 40 mg by mouth daily.      . valACYclovir (VALTREX) 500 MG tablet Take 500 mg by mouth daily.        Patient confirms/reports the following allergies:  Allergies  Allergen Reactions  . Penicillins Rash    Patient is appropriate to schedule for requested procedure(s): yes  AUTHORIZATION INFORMATION Primary Insurance:   ID #:  Group #:  Pre-Cert / Auth required:  Pre-Cert / Auth #:   Secondary Insurance:  ID #:  Group #:    Pre-Cert / Auth required:  Pre-Cert / Auth #:   No orders of the defined types were placed in this encounter.    SCHEDULE INFORMATION: Procedure has been scheduled as follows:  Date: 04/05/2012        Time: 9:30 AM  Location: [facility] short Stay  This Gastroenterology Pre-Precedure Form is being routed to the following provider(s) for review: R. Roetta Sessions, MD

## 2012-03-04 NOTE — Telephone Encounter (Signed)
OK to schedule

## 2012-03-08 MED ORDER — PEG 3350-KCL-NA BICARB-NACL 420 G PO SOLR: 4000.0000 mL | ORAL | Status: AC

## 2012-03-08 NOTE — Telephone Encounter (Signed)
Rx sent to [facility] pharmacy. Instructions mailed to pt.

## 2012-03-10 NOTE — Patient Instructions (Addendum)
Your procedure is scheduled on: 03/18/2012   Report to [facility] at  7:40    AM.  Call this number if you have problems the morning of surgery: [phone]   Remember:   Do not eat or drink :After Midnight.    Take these medicines the morning of surgery with A SIP OF WATER: Clonazepam, lisinopril, metoprolol, protonix   Do not wear jewelry, make-up or nail polish.  Do not wear lotions, powders, or perfumes. You may wear deodorant.  Do not shave 48 hours prior to surgery.  Do not bring valuables to the hospital.  Contacts, dentures or bridgework may not be worn into surgery.  Patients discharged the day of surgery will not be allowed to drive home.  Name and phone number of your driver:    Please read over the following fact sheets that you were given: Pain Booklet, Surgical Site Infection Prevention, Anesthesia Post-op Instructions and Care and Recovery After Surgery  Cataract Surgery  A cataract is a clouding of the lens of the eye. When a lens becomes cloudy, vision is reduced based on the degree and nature of the clouding. Surgery may be needed to improve vision. Surgery removes the cloudy lens and usually replaces it with a substitute lens (intraocular lens, IOL). LET YOUR EYE DOCTOR KNOW ABOUT:  Allergies to food or medicine.   Medicines taken including herbs, eyedrops, over-the-counter medicines, and creams.   Use of steroids (by mouth or creams).   Previous problems with anesthetics or numbing medicine.   History of bleeding problems or blood clots.   Previous surgery.   Other health problems, including diabetes and kidney problems.   Possibility of pregnancy, if this applies.  RISKS AND COMPLICATIONS  Infection.   Inflammation of the eyeball (endophthalmitis) that can spread to both eyes (sympathetic ophthalmia).   Poor wound healing.   If an IOL is inserted, it can later fall out of proper position. This is very uncommon.   Clouding of the part of your eye  that holds an IOL in place. This is called an "after-cataract." These are uncommon, but easily treated.  BEFORE THE PROCEDURE  Do not eat or drink anything except small amounts of water for 8 to 12 before your surgery, or as directed by your caregiver.   Unless you are told otherwise, continue any eyedrops you have been prescribed.   Talk to your primary caregiver about all other medicines that you take (both prescription and non-prescription). In some cases, you may need to stop or change medicines near the time of your surgery. This is most important if you are taking blood-thinning medicine.Do not stop medicines unless you are told to do so.   Arrange for someone to drive you to and from the procedure.   Do not put contact lenses in either eye on the day of your surgery.  PROCEDURE There is more than one method for safely removing a cataract. Your doctor can explain the differences and help determine which is best for you. Phacoemulsification surgery is the most common form of cataract surgery.  An injection is given behind the eye or eyedrops are given to make this a painless procedure.   A small cut (incision) is made on the edge of the clear, dome-shaped surface that covers the front of the eye (cornea).   A tiny probe is painlessly inserted into the eye. This device gives off ultrasound waves that soften and break up the cloudy center of the lens. This makes  it easier for the cloudy lens to be removed by suction.   An IOL may be implanted.   The normal lens of the eye is covered by a clear capsule. Part of that capsule is intentionally left in the eye to support the IOL.   Your surgeon may or may not use stitches to close the incision.  There are other forms of cataract surgery that require a larger incision and stiches to close the eye. This approach is taken in cases where the doctor feels that the cataract cannot be easily removed using phacoemulsification. AFTER THE  PROCEDURE  When an IOL is implanted, it does not need care. It becomes a permanent part of your eye and cannot be seen or felt.   Your doctor will schedule follow-up exams to check on your progress.   Review your other medicines with your doctor to see which can be resumed after surgery.   Use eyedrops or take medicine as prescribed by your doctor.  Document Released: 06/19/2011 Document Reviewed: 06/16/2011 [facility] Patient Information 2012 [place].  .Cataract Surgery Care After Refer to this sheet in the next few weeks. These instructions provide you with information on caring for yourself after your procedure. Your caregiver may also give you more specific instructions. Your treatment has been planned according to current medical practices, but problems sometimes occur. Call your caregiver if you have any problems or questions after your procedure.  HOME CARE INSTRUCTIONS   Avoid strenuous activities as directed by your caregiver.   Ask your caregiver when you can resume driving.   Use eyedrops or other medicines to help healing and control pressure inside your eye as directed by your caregiver.   Only take over-the-counter or prescription medicines for pain, discomfort, or fever as directed by your caregiver.   Do not to touch or rub your eyes.   You may be instructed to use a protective shield during the first few days and nights after surgery. If not, wear sunglasses to protect your eyes. This is to protect the eye from pressure or from being accidentally bumped.   Keep the area around your eye clean and dry. Avoid swimming or allowing water to hit you directly in the face while showering. Keep soap and shampoo out of your eyes.   Do not bend or lift heavy objects. Bending increases pressure in the eye. You can walk, climb stairs, and do light household chores.   Do not put a contact lens into the eye that had surgery until your caregiver says it is okay to do so.    Ask your doctor when you can return to work. This will depend on the kind of work that you do. If you work in a dusty environment, you may be advised to wear protective eyewear for a period of time.   Ask your caregiver when it will be safe to engage in sexual activity.   Continue with your regular eye exams as directed by your caregiver.  What to expect:  It is normal to feel itching and mild discomfort for a few days after cataract surgery. Some fluid discharge is also common, and your eye may be sensitive to light and touch.   After 1 to 2 days, even moderate discomfort should disappear. In most cases, healing will take about 6 weeks.   If you received an intraocular lens (IOL), you may notice that colors are very bright or have a blue tinge. Also, if you have been in bright sunlight,  everything may appear reddish for a few hours. If you see these color tinges, it is because your lens is clear and no longer cloudy. Within a few months after receiving an IOL, these extra colors should go away. When you have healed, you will probably need new glasses.  SEEK MEDICAL CARE IF:   You have increased bruising around your eye.   You have discomfort not helped by medicine.  SEEK IMMEDIATE MEDICAL CARE IF:   You have a fever.   You have a worsening or sudden vision loss.   You have redness, swelling, or increasing pain in the eye.   You have a thick discharge from the eye that had surgery.  MAKE SURE YOU:  Understand these instructions.   Will watch your condition.   Will get help right away if you are not doing well or get worse.  Document Released: 01/17/2005 Document Revised: 06/19/2011 Document Reviewed: 02/21/2011 [facility] Patient Information 2012 [place], [place].

## 2012-03-11 ENCOUNTER — Encounter (HOSPITAL_COMMUNITY)
Admission: RE | Admit: 2012-03-11 | Discharge: 2012-03-11 | Payer: 59 | Source: Ambulatory Visit | Attending: Ophthalmology | Admitting: Ophthalmology

## 2012-03-11 ENCOUNTER — Encounter (HOSPITAL_COMMUNITY): Payer: Self-pay

## 2012-03-11 HISTORY — DX: Pure hypercholesterolemia, unspecified: E78.00

## 2012-03-11 HISTORY — DX: Gastro-esophageal reflux disease without esophagitis: K21.9

## 2012-03-11 HISTORY — DX: Atherosclerotic heart disease of native coronary artery without angina pectoris: I25.10

## 2012-03-11 HISTORY — DX: Restless legs syndrome: G25.81

## 2012-03-11 HISTORY — DX: Essential (primary) hypertension: I10

## 2012-03-11 HISTORY — DX: Acute myocardial infarction, unspecified: I21.9

## 2012-03-11 HISTORY — DX: Sleep apnea, unspecified: G47.30

## 2012-03-11 HISTORY — DX: Calculus of kidney: N20.0

## 2012-03-11 HISTORY — DX: Atelectasis: J98.11

## 2012-03-12 ENCOUNTER — Telehealth: Payer: Self-pay

## 2012-03-12 MED ORDER — PEG 3350-KCL-NA BICARB-NACL 420 G PO SOLR: 4000.0000 mL | ORAL | Status: AC

## 2012-03-12 MED ORDER — PEG-KCL-NACL-NASULF-NA ASC-C 100 G PO SOLR: 1.0000 | ORAL | Status: DC

## 2012-03-12 NOTE — Telephone Encounter (Signed)
Pt's wife Arline Asp called and said pt could not use the Trilyte Prep. He requested the split Prep. Per Tana Coast, PA , Movie prep sent to pharmacy and new instructions mailed to pt.

## 2012-03-12 NOTE — Telephone Encounter (Signed)
Pt called and said the trilyte prep had been sent again. I should have sent the movie Prep. I called and spoke to Caryn Bee at [facility] and told him to cancel the trilyte I am sending the Movie Prep. The Movie prep instructions have already been mailed to pt.

## 2012-03-17 ENCOUNTER — Telehealth: Payer: Self-pay

## 2012-03-17 MED ORDER — CYCLOPENTOLATE-PHENYLEPHRINE 0.2-1 % OP SOLN
OPHTHALMIC | Status: AC
  Filled 2012-03-17: qty 2

## 2012-03-17 MED ORDER — TETRACAINE HCL 0.5 % OP SOLN
OPHTHALMIC | Status: AC
  Filled 2012-03-17: qty 2

## 2012-03-17 MED ORDER — NEOMYCIN-POLYMYXIN-DEXAMETH 3.5-10000-0.1 OP OINT
TOPICAL_OINTMENT | OPHTHALMIC | Status: AC
  Filled 2012-03-17: qty 3.5

## 2012-03-17 MED ORDER — LIDOCAINE HCL (PF) 1 % IJ SOLN
INTRAMUSCULAR | Status: AC
  Filled 2012-03-17: qty 2

## 2012-03-17 MED ORDER — PHENYLEPHRINE HCL 2.5 % OP SOLN
OPHTHALMIC | Status: AC
  Filled 2012-03-17: qty 2

## 2012-03-17 MED ORDER — LIDOCAINE HCL 3.5 % OP GEL
OPHTHALMIC | Status: AC
  Filled 2012-03-17: qty 5

## 2012-03-17 NOTE — Telephone Encounter (Signed)
Called and spoke to [employer] is not required for screening colonoscopy. [facility] [phone]).

## 2012-03-18 ENCOUNTER — Ambulatory Visit (HOSPITAL_COMMUNITY): Payer: 59 | Admitting: Anesthesiology

## 2012-03-18 ENCOUNTER — Encounter (HOSPITAL_COMMUNITY): Payer: Self-pay | Admitting: Ophthalmology

## 2012-03-18 ENCOUNTER — Ambulatory Visit (HOSPITAL_COMMUNITY)
Admission: RE | Admit: 2012-03-18 | Discharge: 2012-03-18 | Disposition: A | Discharge status: Home / Self Care | Payer: 59 | Source: Ambulatory Visit | Attending: Ophthalmology | Admitting: Ophthalmology

## 2012-03-18 ENCOUNTER — Encounter (HOSPITAL_COMMUNITY): Payer: Self-pay | Admitting: Anesthesiology

## 2012-03-18 ENCOUNTER — Encounter (HOSPITAL_COMMUNITY)
Admission: RE | Disposition: A | Discharge status: Home / Self Care | Payer: Self-pay | Source: Ambulatory Visit | Attending: Ophthalmology

## 2012-03-18 ENCOUNTER — Encounter (HOSPITAL_COMMUNITY): Payer: Self-pay | Admitting: *Deleted

## 2012-03-18 DIAGNOSIS — H269 Unspecified cataract: Secondary | ICD-10-CM | POA: Insufficient documentation

## 2012-03-18 DIAGNOSIS — I10 Essential (primary) hypertension: Secondary | ICD-10-CM | POA: Insufficient documentation

## 2012-03-18 HISTORY — PX: CATARACT EXTRACTION W/PHACO: SHX586

## 2012-03-18 SURGERY — PHACOEMULSIFICATION, CATARACT, WITH IOL INSERTION
Anesthesia: Monitor Anesthesia Care | Site: Eye | Laterality: Left | Wound class: Clean

## 2012-03-18 MED ORDER — MIDAZOLAM HCL 5 MG/5ML IJ SOLN
INTRAMUSCULAR | Status: DC | PRN
  Administered 2012-03-18: 2 mg via INTRAVENOUS

## 2012-03-18 MED ORDER — PROVISC 10 MG/ML IO SOLN
INTRAOCULAR | Status: DC | PRN
  Administered 2012-03-18: 8.5 mg via INTRAOCULAR

## 2012-03-18 MED ORDER — BSS IO SOLN
INTRAOCULAR | Status: DC | PRN
  Administered 2012-03-18: 15 mL via INTRAOCULAR

## 2012-03-18 MED ORDER — LIDOCAINE 3.5 % OP GEL OPTIME - NO CHARGE
OPHTHALMIC | Status: DC | PRN
  Administered 2012-03-18: 2 [drp] via OPHTHALMIC

## 2012-03-18 MED ORDER — EPINEPHRINE HCL 1 MG/ML IJ SOLN
INTRAMUSCULAR | Status: AC
  Filled 2012-03-18: qty 1

## 2012-03-18 MED ORDER — NEOMYCIN-POLYMYXIN-DEXAMETH 0.1 % OP OINT
TOPICAL_OINTMENT | OPHTHALMIC | Status: DC | PRN
  Administered 2012-03-18: 1 via OPHTHALMIC

## 2012-03-18 MED ORDER — LACTATED RINGERS IV SOLN
INTRAVENOUS | Status: DC | PRN
  Administered 2012-03-18: 08:00:00 via INTRAVENOUS

## 2012-03-18 MED ORDER — LIDOCAINE HCL (PF) 1 % IJ SOLN
INTRAMUSCULAR | Status: DC | PRN
  Administered 2012-03-18: .4 mL

## 2012-03-18 MED ORDER — EPINEPHRINE HCL 1 MG/ML IJ SOLN
INTRAOCULAR | Status: DC | PRN
  Administered 2012-03-18: 09:00:00

## 2012-03-18 MED ORDER — PHENYLEPHRINE HCL 2.5 % OP SOLN
1.0000 [drp] | OPHTHALMIC | Status: AC
  Administered 2012-03-18 (×3): 1 [drp] via OPHTHALMIC

## 2012-03-18 MED ORDER — LACTATED RINGERS IV SOLN
INTRAVENOUS | Status: DC
  Administered 2012-03-18: 09:00:00 via INTRAVENOUS

## 2012-03-18 MED ORDER — LIDOCAINE HCL 3.5 % OP GEL: 1.0000 "application " | Freq: Once | OPHTHALMIC | Status: DC

## 2012-03-18 MED ORDER — CYCLOPENTOLATE-PHENYLEPHRINE 0.2-1 % OP SOLN
1.0000 [drp] | OPHTHALMIC | Status: AC
  Administered 2012-03-18 (×3): 1 [drp] via OPHTHALMIC

## 2012-03-18 MED ORDER — MIDAZOLAM HCL 2 MG/2ML IJ SOLN
INTRAMUSCULAR | Status: AC
  Filled 2012-03-18: qty 2

## 2012-03-18 MED ORDER — TETRACAINE HCL 0.5 % OP SOLN
1.0000 [drp] | OPHTHALMIC | Status: AC
  Administered 2012-03-18 (×3): 1 [drp] via OPHTHALMIC

## 2012-03-18 MED ORDER — POVIDONE-IODINE 5 % OP SOLN
OPHTHALMIC | Status: DC | PRN
  Administered 2012-03-18: 1 via OPHTHALMIC

## 2012-03-18 MED ORDER — MIDAZOLAM HCL 2 MG/2ML IJ SOLN
1.0000 mg | INTRAMUSCULAR | Status: DC | PRN
  Administered 2012-03-18: 2 mg via INTRAVENOUS

## 2012-03-18 SURGICAL SUPPLY — 32 items

## 2012-03-18 NOTE — Transfer of Care (Signed)
Immediate Anesthesia Transfer of Care Note  Patient: Rodney Moore  Procedure(s) Performed: Procedure(s) (LRB) with comments: CATARACT EXTRACTION PHACO AND INTRAOCULAR LENS PLACEMENT (IOC) (Left) - CDE: 3.95  Patient Location: PACU and Short Stay  Anesthesia Type: MAC  Level of Consciousness: awake  Airway & Oxygen Therapy: Patient Spontanous Breathing  Post-op Assessment: Report given to PACU RN  Post vital signs: Reviewed and stable  Complications: No apparent anesthesia complications

## 2012-03-18 NOTE — H&P (Signed)
I have reviewed the H&P, the patient was re-examined, and I have identified no interval changes in medical condition and plan of care since the history and physical of record  

## 2012-03-18 NOTE — Anesthesia Postprocedure Evaluation (Signed)
  Anesthesia Post-op Note  Patient: Rodney Moore  Procedure(s) Performed: Procedure(s) (LRB) with comments: CATARACT EXTRACTION PHACO AND INTRAOCULAR LENS PLACEMENT (IOC) (Left) - CDE: 3.95  Patient Location: PACU and Short Stay  Anesthesia Type: MAC  Level of Consciousness: awake, alert  and oriented  Airway and Oxygen Therapy: Patient Spontanous Breathing  Post-op Pain: none  Post-op Assessment: Post-op Vital signs reviewed, Patient's Cardiovascular Status Stable, Respiratory Function Stable, Patent Airway and No signs of Nausea or vomiting  Post-op Vital Signs: Reviewed and stable  Complications: No apparent anesthesia complications

## 2012-03-18 NOTE — Brief Op Note (Signed)
Pre-Op Dx: Cataract OS Post-Op Dx: Cataract OS Surgeon: Shrita Thien Anesthesia: Topical with MAC Surgery: Cataract Extraction with Intraocular lens Implant OS Implant: B&L enVista Specimen: None Complications: None 

## 2012-03-18 NOTE — Anesthesia Preprocedure Evaluation (Signed)
Anesthesia Evaluation  Patient identified by MRN, date of birth, ID band Patient awake    Reviewed: Allergy & Precautions, H&P , NPO status , Patient's Chart, lab work & pertinent test results, reviewed documented beta blocker date and time   History of Anesthesia Complications Negative for: history of anesthetic complications  Airway Mallampati: III      Dental  (+) Teeth Intact and Poor Dentition   Pulmonary  Hx spontaneous  PTX 1991, no problems since  breath sounds clear to auscultation        Cardiovascular hypertension, - angina+ CAD and + Past MI Rhythm:Regular Rate:Normal     Neuro/Psych    GI/Hepatic GERD-  ,  Endo/Other    Renal/GU Renal disease     Musculoskeletal   Abdominal   Peds  Hematology   Anesthesia Other Findings   Reproductive/Obstetrics                           Anesthesia Physical Anesthesia Plan  ASA: III  Anesthesia Plan: MAC   Post-op Pain Management:    Induction: Intravenous  Airway Management Planned: Nasal Cannula  Additional Equipment:   Intra-op Plan:   Post-operative Plan:   Informed Consent: I have reviewed the patients History and Physical, chart, labs and discussed the procedure including the risks, benefits and alternatives for the proposed anesthesia with the patient or authorized representative who has indicated his/her understanding and acceptance.     Plan Discussed with:   Anesthesia Plan Comments:         Anesthesia Quick Evaluation

## 2012-03-19 NOTE — Op Note (Signed)
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  OPERATION PERFORMED:  Phacoemulsification with posterior chamber intraocular lens implantation, left eye.  SURGEON:  Susanne Greenhouse, MD.  ANESTHESIA:  Topical with IV sedation.  OPERATIVE SUMMARY:  In the preoperative area, dilating drops were placed into the left eye.  The patient was then brought into the operating room where he was placed under general anesthesia.  The eye was then prepped and draped.  Beginning with a 75 blade, a paracentesis port was made at the surgeon's 2 o'clock position.  The anterior chamber was then filled with a 1% nonpreserved lidocaine solution with epinephrine.  This was followed by Viscoat to deepen the chamber.  A small fornix-based peritomy was performed superiorly.  Next, a single iris hook was placed through the limbus superiorly.  A 2.4-mm keratome blade was then used to make a clear corneal incision over the iris hook.  A bent cystotome needle and Utrata forceps were used to create a continuous tear capsulotomy.  Hydrodissection was performed using balanced salt solution on a fine cannula.  The lens nucleus was then removed using phacoemulsification in a quadrant cracking technique.  The cortical material was then removed with irrigation and aspiration.  The capsular bag and anterior chamber were refilled with Provisc.  The wound was widened to approximately 3 mm and a posterior chamber intraocular lens was placed into the capsular bag without difficulty using an  [employer] lens injecting system.  A single 10-0 nylon suture was then used to close the incision as well as stromal hydration.  The Provisc was removed from the anterior chamber and capsular bag with irrigation and aspiration.  At this point, the wounds were tested for leak, which were negative.  The anterior chamber remained deep and stable.  The patient tolerated the procedure well.  There were no operative complications, and he awoke from general anesthesia without problem.  Prosthetic device used is a [employer] enVista posterior chamber lens model MX60, power of 21.5, serial number is [id number].          ______________________________ Susanne Greenhouse, MD     KEH/MEDQ  D:  03/18/2012  T:  03/19/2012  Job:  [id number]

## 2012-03-22 ENCOUNTER — Encounter (HOSPITAL_COMMUNITY): Payer: Self-pay | Admitting: Ophthalmology

## 2012-03-24 ENCOUNTER — Encounter (HOSPITAL_COMMUNITY): Payer: Self-pay | Admitting: Pharmacy Technician

## 2012-03-30 ENCOUNTER — Telehealth: Payer: Self-pay

## 2012-03-30 HISTORY — PX: TRANSTHORACIC ECHOCARDIOGRAM: SHX275

## 2012-03-30 NOTE — Telephone Encounter (Signed)
Called pt to update triage prior to colonoscopy on 04/05/2012. There has been no new problems and no change in medications. He has prescription and instructions.

## 2012-03-31 NOTE — Telephone Encounter (Signed)
OK to schedule

## 2012-04-05 ENCOUNTER — Encounter (HOSPITAL_COMMUNITY)
Admission: RE | Disposition: A | Discharge status: Home / Self Care | Payer: Self-pay | Source: Ambulatory Visit | Attending: Internal Medicine

## 2012-04-05 ENCOUNTER — Encounter (HOSPITAL_COMMUNITY): Payer: Self-pay | Admitting: *Deleted

## 2012-04-05 ENCOUNTER — Ambulatory Visit (HOSPITAL_COMMUNITY)
Admission: RE | Admit: 2012-04-05 | Discharge: 2012-04-05 | Disposition: A | Discharge status: Home / Self Care | Payer: 59 | Source: Ambulatory Visit | Attending: Internal Medicine | Admitting: Internal Medicine

## 2012-04-05 DIAGNOSIS — K5730 Diverticulosis of large intestine without perforation or abscess without bleeding: Secondary | ICD-10-CM | POA: Insufficient documentation

## 2012-04-05 DIAGNOSIS — E7800 Pure hypercholesterolemia, unspecified: Secondary | ICD-10-CM | POA: Insufficient documentation

## 2012-04-05 DIAGNOSIS — D128 Benign neoplasm of rectum: Secondary | ICD-10-CM

## 2012-04-05 DIAGNOSIS — Z1211 Encounter for screening for malignant neoplasm of colon: Secondary | ICD-10-CM

## 2012-04-05 DIAGNOSIS — Z139 Encounter for screening, unspecified: Secondary | ICD-10-CM

## 2012-04-05 DIAGNOSIS — D129 Benign neoplasm of anus and anal canal: Secondary | ICD-10-CM

## 2012-04-05 DIAGNOSIS — I10 Essential (primary) hypertension: Secondary | ICD-10-CM | POA: Insufficient documentation

## 2012-04-05 HISTORY — PX: COLONOSCOPY: SHX5424

## 2012-04-05 SURGERY — COLONOSCOPY
Anesthesia: Moderate Sedation

## 2012-04-05 MED ORDER — STERILE WATER FOR IRRIGATION IR SOLN
Status: DC | PRN
  Administered 2012-04-05: 10:00:00

## 2012-04-05 MED ORDER — MEPERIDINE HCL 100 MG/ML IJ SOLN
INTRAMUSCULAR | Status: DC | PRN
  Administered 2012-04-05 (×3): 50 mg via INTRAVENOUS

## 2012-04-05 MED ORDER — MEPERIDINE HCL 100 MG/ML IJ SOLN
INTRAMUSCULAR | Status: AC
  Filled 2012-04-05: qty 2

## 2012-04-05 MED ORDER — MIDAZOLAM HCL 5 MG/5ML IJ SOLN
INTRAMUSCULAR | Status: AC
  Filled 2012-04-05: qty 10

## 2012-04-05 MED ORDER — MIDAZOLAM HCL 5 MG/5ML IJ SOLN
INTRAMUSCULAR | Status: DC | PRN
  Administered 2012-04-05 (×3): 2 mg via INTRAVENOUS

## 2012-04-05 MED ORDER — SODIUM CHLORIDE 0.45 % IV SOLN
INTRAVENOUS | Status: DC
  Administered 2012-04-05: 10:00:00 via INTRAVENOUS

## 2012-04-05 NOTE — Op Note (Signed)
[facility] [address] [place], [address]   COLONOSCOPY PROCEDURE REPORT  PATIENT: Rodney Moore, Rodney Moore  MR#:         [id number] BIRTHDATE: 1960/10/29 , 50  yrs. old GENDER: Male ENDOSCOPIST: R.  Roetta Sessions, MD FACP FACG REFERRED BY:  Carylon Perches, M.D. PROCEDURE DATE:  04/05/2012 PROCEDURE:     colonoscopy with biopsy  INDICATIONS: colorectal cancer screening.  INFORMED CONSENT:  The risks, benefits, alternatives and imponderables including but not limited to bleeding, perforation as well as the possibility of a missed lesion have been reviewed.  The potential for biopsy, lesion removal, etc. have also been discussed.  Questions have been answered.  All parties agreeable. Please see the history and physical in the medical record for more information.  MEDICATIONS: Versed 6 mg IV and Demerol 150 mg IV in divided doses.  DESCRIPTION OF PROCEDURE:  After a digital rectal exam was performed, the EC-3890LI ([id number])  colonoscope was advanced from the anus through the rectum and colon to the area of the cecum, ileocecal valve and appendiceal orifice.  The cecum was deeply intubated.  These structures were well-seen and photographed for the record.  From the level of the cecum and ileocecal valve, the scope was slowly and cautiously withdrawn.  The mucosal surfaces were carefully surveyed utilizing scope tip deflection to facilitate fold flattening as needed.  The scope was pulled down into the rectum where a thorough examination including retroflexion was performed.    FINDINGS:  Adequate preparation. Left-sided diverticula. Diminutive polyp at the rectosigmoid junction; otherwise, the remainder of colonic mucosa appeared normal. The distal 10 cm of terminal ileal mucosa appeared normal.  THERAPEUTIC / DIAGNOSTIC MANEUVERS PERFORMED:  The above-mentioned polyp was cold biopsied/removed  COMPLICATIONS: none  CECAL WITHDRAWAL TIME:  8 minutes  IMPRESSION:   Rectosigmoid polyp-removed as described above. Colonic diverticulosis.  RECOMMENDATIONS:  Followup on pathology.   _______________________________ eSigned:  R. Roetta Sessions, MD FACP [facility] 04/05/2012 11:10 AM   CC:    PATIENT NAME:  [name], [name] MR#: [id number]

## 2012-04-05 NOTE — H&P (Signed)
Primary Care Physician:  Carylon Perches, MD Primary Gastroenterologist:  Dr. Jena Gauss  Pre-Procedure History & Physical: HPI:  Rodney Moore is a 51 y.o. male here for for screening colonoscopy. No bowel symptoms. States he had polyps removed at colonoscopy some 10 years ago. Those records are not available for review in the system at this time. No family history colon polyps or colon cancer.  Past Medical History  Diagnosis Date  . Myocardial infarction 2004    [facility], [facility]  . Coronary artery disease   . Hypertension   . Hypercholesteremia   . GERD (gastroesophageal reflux disease)   . Restless leg syndrome   . Kidney stones     recurrent, every year since 1993  . Atelectasis of right lung     history of  . Sleep apnea     STOP BANG score =4    Past Surgical History  Procedure Date  . Cardiac catheterization 2004    1 stent placed  . Cholecystectomy   . Chest tube insertion 1991    [facility], [facility], [place]  . Cystoscopy   . Cataract extraction w/phaco 03/18/2012    Procedure: CATARACT EXTRACTION PHACO AND INTRAOCULAR LENS PLACEMENT (IOC);  Surgeon: Gemma Payor, MD;  Location: [facility];  Service: Ophthalmology;  Laterality: Left;  CDE: 3.95    Prior to Admission medications   Medication Sig Start Date End Date Taking? Authorizing Provider  aspirin EC 81 MG tablet Take 81 mg by mouth daily.   Yes Historical Provider, MD  atorvastatin (LIPITOR) 40 MG tablet Take 40 mg by mouth daily.   Yes Historical Provider, MD  cetirizine (ZYRTEC) 10 MG tablet Take 10 mg by mouth daily.   Yes Historical Provider, MD  clonazePAM (KLONOPIN) 0.5 MG tablet Take 0.5 mg by mouth daily.    Yes Historical Provider, MD  fish oil-omega-3 fatty acids 1000 MG capsule Take 1 g by mouth daily.   Yes Historical Provider, MD  L-Lysine 500 MG CAPS Take 1 capsule by mouth daily.   Yes Historical Provider, MD  lisinopril (PRINIVIL,ZESTRIL) 10 MG tablet Take 10 mg by mouth daily.   Yes Historical Provider, MD    metoprolol (LOPRESSOR) 50 MG tablet Take 25 mg by mouth 2 (two) times daily.   Yes Historical Provider, MD  pantoprazole (PROTONIX) 40 MG tablet Take 40 mg by mouth daily.   Yes Historical Provider, MD  peg 3350 powder (MOVIPREP) 100 G SOLR Take 1 kit (100 g total) by mouth as directed. 03/12/12  Yes Corbin Ade, MD  valACYclovir (VALTREX) 500 MG tablet Take 500 mg by mouth daily.   Yes Historical Provider, MD    Allergies as of 03/04/2012 - Review Complete 03/04/2012  Allergen Reaction Noted  . Penicillins Rash 03/03/2012    History reviewed. No pertinent family history.  History   Social History  . Marital Status: Married    Spouse Name: N/A    Number of Children: N/A  . Years of Education: N/A   Occupational History  . Not on file.   Social History Main Topics  . Smoking status: Former Smoker    Types: Cigarettes  . Smokeless tobacco: Not on file  . Alcohol Use: Yes     rarely  . Drug Use: No  . Sexually Active:    Other Topics Concern  . Not on file   Social History Narrative  . No narrative on file    Review of Systems: See HPI, otherwise negative ROS  Physical  Exam: BP 128/82  Pulse 57  Temp 97.8 F (36.6 C) (Oral)  Resp 16  SpO2 100% General:   Alert,  Well-developed, well-nourished, pleasant and cooperative in NAD Skin:  Intact without significant lesions or rashes. Eyes:  Sclera clear, no icterus.   Conjunctiva pink. Ears:  Normal auditory acuity. Nose:  No deformity, discharge,  or lesions. Mouth:  No deformity or lesions. Neck:  Supple; no masses or thyromegaly. No significant cervical adenopathy. Lungs:  Clear throughout to auscultation.   No wheezes, crackles, or rhonchi. No acute distress. Heart:  Regular rate and rhythm; no murmurs, clicks, rubs,  or gallops. Abdomen: Non-distended, normal bowel sounds.  Soft and nontender without appreciable mass or hepatosplenomegaly.  Pulses:  Normal pulses noted. Extremities:  Without clubbing or  edema.  Impression/Plan:  Get very pleasant 51 year old gentleman presents for screening colonoscopy.The risks, benefits, limitations, alternatives and imponderables have been reviewed with the patient. Questions have been answered. All parties are agreeable.

## 2012-04-07 ENCOUNTER — Encounter: Payer: Self-pay | Admitting: *Deleted

## 2012-04-07 ENCOUNTER — Encounter: Payer: Self-pay | Admitting: Internal Medicine

## 2012-04-09 ENCOUNTER — Encounter (HOSPITAL_COMMUNITY): Payer: Self-pay | Admitting: Internal Medicine

## 2013-02-19 ENCOUNTER — Encounter: Payer: Self-pay | Admitting: *Deleted

## 2013-02-21 ENCOUNTER — Encounter: Payer: Self-pay | Admitting: Internal Medicine

## 2013-02-22 ENCOUNTER — Ambulatory Visit (INDEPENDENT_AMBULATORY_CARE_PROVIDER_SITE_OTHER): Payer: 59 | Admitting: Internal Medicine

## 2013-02-22 ENCOUNTER — Encounter: Payer: Self-pay | Admitting: Internal Medicine

## 2013-02-22 VITALS — BP 110/82 | HR 64 | Ht 74.0 in | Wt 170.5 lb

## 2013-02-22 DIAGNOSIS — I10 Essential (primary) hypertension: Secondary | ICD-10-CM

## 2013-02-22 DIAGNOSIS — R079 Chest pain, unspecified: Secondary | ICD-10-CM

## 2013-02-22 DIAGNOSIS — I2109 ST elevation (STEMI) myocardial infarction involving other coronary artery of anterior wall: Secondary | ICD-10-CM | POA: Insufficient documentation

## 2013-02-22 DIAGNOSIS — I2589 Other forms of chronic ischemic heart disease: Secondary | ICD-10-CM

## 2013-02-22 DIAGNOSIS — I255 Ischemic cardiomyopathy: Secondary | ICD-10-CM | POA: Insufficient documentation

## 2013-02-22 DIAGNOSIS — E785 Hyperlipidemia, unspecified: Secondary | ICD-10-CM | POA: Insufficient documentation

## 2013-02-22 DIAGNOSIS — R7301 Impaired fasting glucose: Secondary | ICD-10-CM

## 2013-02-22 NOTE — Patient Instructions (Addendum)
Your physician wants you to follow-up in: 1 year. You will receive a reminder letter in the mail two months in advance. If you don't receive a letter, please call our office to schedule the follow-up appointment.  

## 2013-02-22 NOTE — Progress Notes (Signed)
OFFICE NOTE  Chief Complaint:  Routine follow-up  Primary Care Physician: Carylon Perches, MD  HPI:  Rodney Moore is a 52 year old male with history of anterior MI in February 2004. He had a Cypher stent placed to the LAD and the ejection fraction was 35% to 40%. In 2009 the EF had improved to 40% to 45% and overall he has been asymptomatic. He returns today feeling well. Denies any chest pain, worsening shortness of breath, palpitations, presyncope or syncopal symptoms. He had recent blood work through your office which showed a total cholesterol 173. Triglycerides were 257, HDL 35, LDL was 87. Based on these, it was recommended that he change to Lipitor 40 mg daily from pravastatin 40 mg at bedtime. I agree with that assessment. In addition, he had another cholesterol panel on February 19, 2012, which showed total cholesterol of 173, triglycerides 153, HDL 35, LDL of 107. As his LDL goal is less than 70, the Lipitor may help him reach that goal. He is also on fish oil, which should additionally help reach his goals. Today he reported 2 episodes at rest reaction mild chest discomfort he went to his left arm, but he thinks this is associated with anxiety. Over the past year he has had several anti-anxiety medications which she has not tolerated. It has been sometime since his last stress test, but is not interested in at this time.  PMHx:  Past Medical History  Diagnosis Date  . Myocardial infarction 2004    [facility], [facility] Cardiology  . Coronary artery disease   . Hypertension   . Hypercholesteremia   . GERD (gastroesophageal reflux disease)   . Restless leg syndrome   . Kidney stones     recurrent, every year since 1993  . Atelectasis of right lung     history of  . Sleep apnea     STOP BANG score =4  . Ischemic cardiomyopathy     echo 03/2012 EF 40-45%  . History of nuclear stress test 09/2007    exercise myoview; mild perfusion defect in basal/mid inferior walls with mild defect  reversibility; EKG negative for ischemia; low risk scan     Past Surgical History  Procedure Laterality Date  . Cardiac catheterization  2004    Cypher stent (3.0x63mm) to prox LAD   . Cholecystectomy    . Chest tube insertion  1991    [facility], [facility], [place]  . Cystoscopy    . Cataract extraction w/phaco  03/18/2012    Procedure: CATARACT EXTRACTION PHACO AND INTRAOCULAR LENS PLACEMENT (IOC);  Surgeon: Gemma Payor, MD;  Location: [facility] ORS;  Service: Ophthalmology;  Laterality: Left;  CDE: 3.95  . Colonoscopy  04/05/2012    Procedure: COLONOSCOPY;  Surgeon: Corbin Ade, MD;  Location: [facility]O SU[facility];  Service: Endoscopy;  Laterality: N/A;  . Transthoracic echocardiogram  03/30/2012    EF 40-45%: LV systolic function moderately reduced; RA mildly dilated; mild MR; mod TR    FAMHx:  Family History  Problem Relation Age of Onset  . Hypertension Father     SOCHx:   reports that he quit smoking about 6 years ago. His smoking use included Cigarettes. He has a 60 pack-year smoking history. He has never used smokeless tobacco. He reports that he does not drink alcohol or use illicit drugs.  ALLERGIES:  Allergies  Allergen Reactions  . Penicillins Rash    ROS: A comprehensive review of systems was negative except for: Cardiovascular: positive for chest pain  HOME MEDS:  Current Outpatient Prescriptions  Medication Sig Dispense Refill  . aspirin EC 81 MG tablet Take 81 mg by mouth daily.      [name] atorvastatin (LIPITOR) 40 MG tablet Take 40 mg by mouth daily.      . cetirizine (ZYRTEC) 10 MG tablet Take 10 mg by mouth daily.      . clonazePAM (KLONOPIN) 0.5 MG tablet Take 0.5 mg by mouth daily.       . fish oil-omega-3 fatty acids 1000 MG capsule Take 1 g by mouth daily.      [name] lisinopril (PRINIVIL,ZESTRIL) 10 MG tablet Take 10 mg by mouth daily.      . metoprolol (LOPRESSOR) 50 MG tablet Take 25 mg by mouth 2 (two) times daily.      . naproxen sodium (ALEVE) 220 MG tablet Take 220 mg by mouth as  needed.      . pantoprazole (PROTONIX) 40 MG tablet Take 40 mg by mouth daily.      . valACYclovir (VALTREX) 500 MG tablet Take 500 mg by mouth daily.       No current facility-administered medications for this visit.    LABS/IMAGING: No results found for this or any previous visit (from the past 48 hour(s)). No results found.  VITALS: BP 110/82  Pulse 64  Ht 6\' 2"  (1.88 m)  Wt 170 lb 8 oz (77.338 kg)  BMI 21.88 kg/m2  EXAM: General appearance: alert and no distress Neck: no adenopathy, no carotid bruit, no JVD, supple, symmetrical, trachea midline and thyroid not enlarged, symmetric, no tenderness/mass/nodules Lungs: clear to auscultation bilaterally Heart: regular rate and rhythm, S1, S2 normal, no murmur, click, rub or gallop Abdomen: soft, non-tender; bowel sounds normal; no masses,  no organomegaly Extremities: extremities normal, atraumatic, no cyanosis or edema Pulses: 2+ and symmetric Skin: Skin color, texture, turgor normal. No rashes or lesions Neurologic: Grossly normal  EKG: Normal sinus rhythm at 64  ASSESSMENT: 1. Atypical chest pain 2. History of ischemic cardio myopathy EF 45% 3. History of anterior MI with a stent in the LAD 4. Hypertension 5. Dyslipidemia 6. Anxiety  PLAN: 1.   Rodney Moore described 2 small episodes of non-cardiac chest pain. He has actually been increasing his exercise per his report and walks 2-3 miles every day at a brisk pace. He does not describe any worsening symptoms of shortness of breath or chest pain with that. At this time I think the pain that he had been describing is noncardiac. I would continue with his current medications. Understands to see his primary care provider next week and should have a lipid profile which I would like to review. Otherwise, we can see him back in a year.  Chrystie Nose, MD, [facility] Attending Cardiologist [facility]  Joshva Labreck C 02/22/2013, 12:58 PM

## 2013-10-08 ENCOUNTER — Emergency Department (HOSPITAL_COMMUNITY): Payer: 59

## 2013-10-08 ENCOUNTER — Emergency Department (HOSPITAL_COMMUNITY)
Admission: EM | Admit: 2013-10-08 | Discharge: 2013-10-08 | Disposition: A | Discharge status: Home / Self Care | Payer: 59 | Attending: Emergency Medicine | Admitting: Emergency Medicine

## 2013-10-08 ENCOUNTER — Encounter (HOSPITAL_COMMUNITY): Payer: Self-pay | Admitting: Emergency Medicine

## 2013-10-08 DIAGNOSIS — Z79899 Other long term (current) drug therapy: Secondary | ICD-10-CM | POA: Insufficient documentation

## 2013-10-08 DIAGNOSIS — I252 Old myocardial infarction: Secondary | ICD-10-CM | POA: Insufficient documentation

## 2013-10-08 DIAGNOSIS — E7800 Pure hypercholesterolemia, unspecified: Secondary | ICD-10-CM | POA: Insufficient documentation

## 2013-10-08 DIAGNOSIS — I2510 Atherosclerotic heart disease of native coronary artery without angina pectoris: Secondary | ICD-10-CM | POA: Insufficient documentation

## 2013-10-08 DIAGNOSIS — Z7982 Long term (current) use of aspirin: Secondary | ICD-10-CM | POA: Insufficient documentation

## 2013-10-08 DIAGNOSIS — Z8669 Personal history of other diseases of the nervous system and sense organs: Secondary | ICD-10-CM | POA: Insufficient documentation

## 2013-10-08 DIAGNOSIS — Z87891 Personal history of nicotine dependence: Secondary | ICD-10-CM | POA: Insufficient documentation

## 2013-10-08 DIAGNOSIS — I10 Essential (primary) hypertension: Secondary | ICD-10-CM | POA: Insufficient documentation

## 2013-10-08 DIAGNOSIS — Z880 Allergy status to penicillin: Secondary | ICD-10-CM | POA: Insufficient documentation

## 2013-10-08 DIAGNOSIS — R1011 Right upper quadrant pain: Secondary | ICD-10-CM | POA: Insufficient documentation

## 2013-10-08 DIAGNOSIS — Z8709 Personal history of other diseases of the respiratory system: Secondary | ICD-10-CM | POA: Insufficient documentation

## 2013-10-08 DIAGNOSIS — Z87442 Personal history of urinary calculi: Secondary | ICD-10-CM | POA: Insufficient documentation

## 2013-10-08 DIAGNOSIS — K219 Gastro-esophageal reflux disease without esophagitis: Secondary | ICD-10-CM | POA: Insufficient documentation

## 2013-10-08 LAB — CBC WITH DIFFERENTIAL/PLATELET
Basophils Absolute: 0 10*3/uL (ref 0.0–0.1)
Basophils Relative: 0 % (ref 0–1)
Eosinophils Absolute: 0.1 10*3/uL (ref 0.0–0.7)
Eosinophils Relative: 2 % (ref 0–5)
HCT: 39.2 % (ref 39.0–52.0)
Hemoglobin: 13.7 g/dL (ref 13.0–17.0)
Lymphocytes Relative: 35 % (ref 12–46)
Lymphs Abs: 1.9 10*3/uL (ref 0.7–4.0)
MCH: 32.6 pg (ref 26.0–34.0)
MCHC: 34.9 g/dL (ref 30.0–36.0)
MCV: 93.3 fL (ref 78.0–100.0)
Monocytes Absolute: 0.4 10*3/uL (ref 0.1–1.0)
Monocytes Relative: 7 % (ref 3–12)
Neutro Abs: 3 10*3/uL (ref 1.7–7.7)
Neutrophils Relative %: 56 % (ref 43–77)
Platelets: 155 10*3/uL (ref 150–400)
RBC: 4.2 MIL/uL — ABNORMAL LOW (ref 4.22–5.81)
RDW: 13.3 % (ref 11.5–15.5)
WBC: 5.4 10*3/uL (ref 4.0–10.5)

## 2013-10-08 LAB — COMPREHENSIVE METABOLIC PANEL
ALT: 14 U/L (ref 0–53)
AST: 15 U/L (ref 0–37)
Albumin: 3.8 g/dL (ref 3.5–5.2)
Alkaline Phosphatase: 65 U/L (ref 39–117)
BUN: 11 mg/dL (ref 6–23)
CO2: 28 mEq/L (ref 19–32)
Calcium: 9.6 mg/dL (ref 8.4–10.5)
Chloride: 103 mEq/L (ref 96–112)
Creatinine, Ser: 0.75 mg/dL (ref 0.50–1.35)
GFR calc Af Amer: >90 mL/min (ref >90)
GFR calc non Af Amer: >90 mL/min (ref >90)
Glucose, Bld: 108 mg/dL — ABNORMAL HIGH (ref 70–99)
Potassium: 4.2 mEq/L (ref 3.7–5.3)
Sodium: 140 mEq/L (ref 137–147)
Total Bilirubin: 0.6 mg/dL (ref 0.3–1.2)
Total Protein: 7.4 g/dL (ref 6.0–8.3)

## 2013-10-08 LAB — LIPASE, BLOOD: Lipase: 24 U/L (ref 11–59)

## 2013-10-08 MED ORDER — IOHEXOL 300 MG/ML  SOLN
100.0000 mL | Freq: Once | INTRAMUSCULAR | Status: AC | PRN
  Administered 2013-10-08: 100 mL via INTRAVENOUS

## 2013-10-08 MED ORDER — FENTANYL CITRATE 0.05 MG/ML IJ SOLN: 50.0000 ug | Freq: Once | INTRAMUSCULAR | Status: DC

## 2013-10-08 MED ORDER — IOHEXOL 300 MG/ML  SOLN
50.0000 mL | Freq: Once | INTRAMUSCULAR | Status: AC | PRN
  Administered 2013-10-08: 50 mL via ORAL

## 2013-10-08 MED ORDER — SODIUM CHLORIDE 0.9 % IV BOLUS (SEPSIS)
1000.0000 mL | Freq: Once | INTRAVENOUS | Status: AC
  Administered 2013-10-08: 1000 mL via INTRAVENOUS

## 2013-10-08 MED ORDER — ONDANSETRON HCL 4 MG/2ML IJ SOLN: 4.0000 mg | Freq: Once | INTRAMUSCULAR | Status: DC

## 2013-10-08 NOTE — Discharge Instructions (Signed)
Abdominal Pain, Adult °Many things can cause abdominal pain. Usually, abdominal pain is not caused by a disease and will improve without treatment. It can often be observed and treated at home. Your health care provider will do a physical exam and possibly order blood tests and X-rays to help determine the seriousness of your pain. However, in many cases, more time must pass before a clear cause of the pain can be found. Before that point, your health care provider may not know if you need more testing or further treatment. °HOME CARE INSTRUCTIONS  °Monitor your abdominal pain for any changes. The following actions may help to alleviate any discomfort you are experiencing: °· Only take over-the-counter or prescription medicines as directed by your health care provider. °· Do not take laxatives unless directed to do so by your health care provider. °· Try a clear liquid diet (broth, tea, or water) as directed by your health care provider. Slowly move to a bland diet as tolerated. °SEEK MEDICAL CARE IF: °· You have unexplained abdominal pain. °· You have abdominal pain associated with nausea or diarrhea. °· You have pain when you urinate or have a bowel movement. °· You experience abdominal pain that wakes you in the night. °· You have abdominal pain that is worsened or improved by eating food. °· You have abdominal pain that is worsened with eating fatty foods. °SEEK IMMEDIATE MEDICAL CARE IF:  °· Your pain does not go away within 2 hours. °· You have a fever. °· You keep throwing up (vomiting). °· Your pain is felt only in portions of the abdomen, such as the right side or the left lower portion of the abdomen. °· You pass bloody or black tarry stools. °MAKE SURE YOU: °· Understand these instructions.   °· Will watch your condition.   °· Will get help right away if you are not doing well or get worse.   °Document Released: 04/09/2005 Document Revised: 04/20/2013 Document Reviewed: 03/09/2013 °[employer]® Patient  Information ©2014 [employer]. ° °

## 2013-10-08 NOTE — ED Notes (Signed)
Pt c/o right chest/rib pain since Monday worsening last night. Pt has h/s spontaneous right pneumothorax in 1991. Denies sob.

## 2013-10-08 NOTE — ED Provider Notes (Signed)
CSN: [id number]     Arrival date & time 10/08/13  1033 History  This chart was scribed for Virgel Manifold, MD by Roxan Diesel, ED scribe.  This patient was seen in room [facility]19/[facility] and the patient's care was started at 11:00 AM.   Chief Complaint  Patient presents with  . Chest Pain    The history is provided by the patient. No language interpreter was used.    HPI Comments: Rodney Moore is a 53 y.o. male with h/o spontaneous pneumothorax, MI, CAD, ischemic cardiomyopathy, HTN, hypercholesteremia, GERD and sleep apnea who presents to the Emergency Department complaining of right chest pain that began 5 nights ago and worsened last night.  Pt states his pain initially began when he was lifting something.  It came on suddenly and "I went to my knees" but it then subsided.  He had some recurrences of the pain over the past week in particular associated with moving his bowels.  Last night it came on more severe as a "pressure" in that area shortly after he ate dinner.  Currently he describes it as "more discomfort than pain."  He denies abdominal pain, fevers, chills, urinary symptoms or SOB.  Pt admits to prior h/o similar pain due to spontaneous right pneumothorax in 1991.  He has h/o cholecystectomy.   Past Medical History  Diagnosis Date  . Myocardial infarction 2004    [facility], [facility] Cardiology  . Coronary artery disease   . Hypertension   . Hypercholesteremia   . GERD (gastroesophageal reflux disease)   . Restless leg syndrome   . Kidney stones     recurrent, every year since 1993  . Atelectasis of right lung     history of  . Sleep apnea     STOP BANG score =4  . Ischemic cardiomyopathy     echo 03/2012 EF 40-45%  . History of nuclear stress test 09/2007    exercise myoview; mild perfusion defect in basal/mid inferior walls with mild defect reversibility; EKG negative for ischemia; low risk scan     Past Surgical History  Procedure Laterality Date  . Cardiac  catheterization  2004    Cypher stent (3.0x35mm) to prox LAD   . Cholecystectomy    . Chest tube insertion  1991    [facility], [facility], [place]  . Cystoscopy    . Cataract extraction w/phaco  03/18/2012    Procedure: CATARACT EXTRACTION PHACO AND INTRAOCULAR LENS PLACEMENT (IOC);  Surgeon: Tonny Branch, MD;  Location: [facility];  Service: Ophthalmology;  Laterality: Left;  CDE: 3.95  . Colonoscopy  04/05/2012    Procedure: COLONOSCOPY;  Surgeon: Daneil Dolin, MD;  Location: [facility];  Service: Endoscopy;  Laterality: N/A;  . Transthoracic echocardiogram  03/30/2012    EF 01-74%: LV systolic function moderately reduced; RA mildly dilated; mild MR; mod TR    Family History  Problem Relation Age of Onset  . Hypertension Father     History  Substance Use Topics  . Smoking status: Former Smoker -- 1.50 packs/day for 40 years    Types: Cigarettes    Quit date: 10/15/2006  . Smokeless tobacco: Never Used  . Alcohol Use: Yes     Comment: occasional     Review of Systems  All other systems reviewed and are negative.      Allergies  Penicillins  Home Medications   Current Outpatient Rx  Name  Route  Sig  Dispense  Refill  . aspirin EC 81 MG  tablet   Oral   Take 81 mg by mouth daily.         [name] atorvastatin (LIPITOR) 40 MG tablet   Oral   Take 40 mg by mouth daily.         . cetirizine (ZYRTEC) 10 MG tablet   Oral   Take 10 mg by mouth daily.         . clonazePAM (KLONOPIN) 0.5 MG tablet   Oral   Take 0.5 mg by mouth daily.          . fish oil-omega-3 fatty acids 1000 MG capsule   Oral   Take 1 g by mouth daily.         [name] lisinopril (PRINIVIL,ZESTRIL) 10 MG tablet   Oral   Take 10 mg by mouth daily.         . metoprolol (LOPRESSOR) 50 MG tablet   Oral   Take 25 mg by mouth 2 (two) times daily.         . naproxen sodium (ALEVE) 220 MG tablet   Oral   Take 220 mg by mouth as needed.         . pantoprazole (PROTONIX) 40 MG tablet   Oral   Take 40 mg by  mouth daily.         . valACYclovir (VALTREX) 500 MG tablet   Oral   Take 500 mg by mouth daily.          BP 139/75  Pulse 59  Temp(Src) 97.6 F (36.4 C)  Resp 18  Ht 6\' 2"  (1.88 m)  Wt 166 lb (75.297 kg)  BMI 21.30 kg/m2  SpO2 98%  Physical Exam  Nursing note and vitals reviewed. Constitutional: He appears well-developed and well-nourished. No distress.  HENT:  Head: Normocephalic and atraumatic.  Eyes: Conjunctivae are normal. Right eye exhibits no discharge. Left eye exhibits no discharge.  Neck: Neck supple.  Cardiovascular: Normal rate, regular rhythm and normal heart sounds.  Exam reveals no gallop and no friction rub.   No murmur heard. Pulmonary/Chest: Effort normal and breath sounds normal. No respiratory distress.  Breath sounds clear and symmetric  Abdominal: Soft. He exhibits no distension. There is tenderness in the right upper quadrant. There is no rebound and no guarding.  Well-healed laparoscopic scars  Musculoskeletal: He exhibits no edema and no tenderness.  Neurological: He is alert.  Skin: Skin is warm and dry.  Psychiatric: He has a normal mood and affect. His behavior is normal. Thought content normal.    ED Course  Procedures (including critical care time)  DIAGNOSTIC STUDIES: Oxygen Saturation is 98% on room air, normal by my interpretation.    COORDINATION OF CARE: 11:06 AM-Discussed treatment plan which includes CXR with pt at bedside and pt agreed to plan.     Labs Review Labs Reviewed  CBC WITH DIFFERENTIAL - Abnormal; Notable for the following:    RBC 4.20 (*)    All other components within normal limits  COMPREHENSIVE METABOLIC PANEL - Abnormal; Notable for the following:    Glucose, Bld 108 (*)    All other components within normal limits  LIPASE, BLOOD    Imaging Review No results found.  Dg Ribs Unilateral W/chest Right  10/08/2013   CLINICAL DATA:  Right lower anterior rib pain.  EXAM: RIGHT RIBS AND CHEST - 3+ VIEW   COMPARISON:  DG CHEST 2 VIEW dated 08/05/2004  FINDINGS: Hyperinflation of the lungs. Heart and mediastinal contours are within  normal limits. No focal opacities or effusions. No acute bony abnormality. No visible rib fracture. No pneumothorax.  IMPRESSION: No active cardiopulmonary disease.   Electronically Signed   By: Rolm Baptise M.D.   On: 10/08/2013 11:36   Ct Abdomen Pelvis W Contrast  10/08/2013   CLINICAL DATA:  Right lower quadrant pain.  EXAM: CT ABDOMEN AND PELVIS WITH CONTRAST  TECHNIQUE: Multidetector CT imaging of the abdomen and pelvis was performed using the standard protocol following bolus administration of intravenous contrast.  CONTRAST:  32mL OMNIPAQUE IOHEXOL 300 MG/ML SOLN, 143mL OMNIPAQUE IOHEXOL 300 MG/ML SOLN  COMPARISON:  CT ABDOMEN W/CM dated 10/12/2006  FINDINGS: Stable punctate nodule at the right lung base on image 7 is likely benign. No evidence for free air.  Stable punctate low-density structures along the inferior right hepatic lobe. The gallbladder has been removed. Portal venous system is patent. Normal appearance of the spleen, pancreas, adrenal glands and both kidneys. Normal appearance of the prostate and urinary bladder. Stomach is mildly distended. The distal abdominal aorta has calcified and noncalcified plaque and measures up to 2.2 cm. The aorta previously measured 1.8 cm and the amount of plaque has progressed. No significant free fluid or lymphadenopathy.  There is a retrocecal appendix without inflammation. Normal appearance of the small and large bowel.  No acute bone abnormality.  IMPRESSION: No acute abnormality in the abdomen or pelvis.  Normal appendix.  Atherosclerotic disease of the abdominal aorta with mild interval enlargement. Currently, the aorta is not aneurysmal.   Electronically Signed   By: Markus Daft M.D.   On: 10/08/2013 13:13    EKG Interpretation   Date/Time:  Saturday October 08 2013 11:11:28 EDT Ventricular Rate:  53 PR Interval:  152 QRS  Duration: 80 QT Interval:  390 QTC Calculation: 365 R Axis:   54 Text Interpretation:  Sinus bradycardia Possible Left atrial enlargement  Septal infarct (cited on or before 04-Sep-2002) Abnormal ECG When compared  with ECG of 06-Sep-2002 07:12, Serial changes of Septal infarct Present ED  PHYSICIAN INTERPRETATION AVAILABLE IN [facility] Confirmed by TEST,  Record ([id number]) on 10/10/2013 6:46:51 AM      MDM   Final diagnoses:  RUQ pain    52yM with RUQ pain. S/o chole. Concerned for spontaneous pneumothorax as reports similar symptoms with prior one. No physical exam or imaging evidence of this. CT a/p w/o acute abnormality. HD stable. Low suspicion for emergent process.    I personally preformed the services scribed in my presence. The recorded information has been reviewed is accurate. Virgel Manifold, MD.    Virgel Manifold, MD 10/13/13 [id number]

## 2014-11-21 ENCOUNTER — Ambulatory Visit (INDEPENDENT_AMBULATORY_CARE_PROVIDER_SITE_OTHER): Payer: BLUE CROSS/BLUE SHIELD | Admitting: Adult Health

## 2014-11-21 ENCOUNTER — Encounter: Payer: Self-pay | Admitting: *Deleted

## 2014-11-21 ENCOUNTER — Encounter: Payer: Self-pay | Admitting: Adult Health

## 2014-11-21 VITALS — BP 120/74 | HR 69 | Ht 73.0 in | Wt 155.0 lb

## 2014-11-21 DIAGNOSIS — R072 Precordial pain: Secondary | ICD-10-CM | POA: Diagnosis not present

## 2014-11-21 MED ORDER — ATORVASTATIN CALCIUM 20 MG PO TABS: 20.0000 mg | ORAL_TABLET | Freq: Every day | ORAL | Status: DC

## 2014-11-21 NOTE — Patient Instructions (Signed)
Your physician recommends that you schedule a follow-up appointment in: After Stress Test   Your physician has recommended you make the following change in your medication:   Lipitor ( Atorvastatin) to 20 mg Daily   Your physician has requested that you have en exercise stress myoview. For further information please visit [email or web address]. Please follow instruction sheet, as given.  Thank you for choosing [facility]!

## 2014-11-21 NOTE — Progress Notes (Signed)
Cardiology Office Note   Date:  11/21/2014   ID:  Rodney Moore, Rodney Moore 02, 1962, MRN [id number]  PCP:  Asencion Noble, MD  Cardiologist:  To bed est with Rodney Chen, NP   Chief Complaint  Patient presents with  . Coronary Artery Disease    DES to LAD.   Rodney Moore Hypertension      History of Present Illness: Rodney Moore is a 54 y.o. male who presents for ongoing assessment and management of CAD, most recently seen by Dr. Debara Pickett, on August of 2014.  The patient has a history of CAD, with Cypher drug-eluting stent to the LAD in February 2004.  Ischemic cardiomyopathy, with most recent echocardiogram completed in 2009, with an EF of 40-45%.  He was continued on statin medications for history of hypercholesterolemia.  Most recent echocardiogram was in 3 2009, revealing a mild perfusion defect in the basal and mid inferior walls with mild defect reversibility.  EKG was negative for ischemia and found to be a low risk study.  He continues to have episodes of noncardiac chest pain, related to anxiety.    He has been sent over from Dr. Ria Moore office to be reestablished with cardiology.  The patient has been having recurrent chest pain, but states she has had chest pain ever since his stent placement.  He walks 3 miles a day without increasing fatigue, or dyspnea.  He is noticing that he does not have as much energy as he used to.  The patient has chronic back pain.  He denies dizziness, significant dyspnea on exertion, palpitations, he is complaining of myalgia pain from atorvastatin.  He states that he decrease the dose from 40 mg daily to 20 mg daily, and the pain went away.  Past Medical History  Diagnosis Date  . Myocardial infarction 2004    [facility], [facility]  . Coronary artery disease   . Hypertension   . Hypercholesteremia   . GERD (gastroesophageal reflux disease)   . Restless leg syndrome   . Kidney stones     recurrent, every year since 1993  . Atelectasis of  right lung     history of  . Sleep apnea     STOP BANG score =4  . Ischemic cardiomyopathy     echo 03/2012 EF 40-45%  . History of nuclear stress test 09/2007    exercise myoview; mild perfusion defect in basal/mid inferior walls with mild defect reversibility; EKG negative for ischemia; low risk scan     Past Surgical History  Procedure Laterality Date  . Cardiac catheterization  2004    Cypher stent (3.0x20mm) to prox LAD   . Cholecystectomy    . Chest tube insertion  1991    [facility], [facility]  . Cystoscopy    . Cataract extraction w/phaco  03/18/2012    Procedure: CATARACT EXTRACTION PHACO AND INTRAOCULAR LENS PLACEMENT (IOC);  Surgeon: Rodney Branch, MD;  Location: [facility];  Service: Ophthalmology;  Laterality: Left;  CDE: 3.95  . Colonoscopy  04/05/2012    Procedure: COLONOSCOPY;  Surgeon: Rodney Dolin, MD;  Location: [facility];  Service: Endoscopy;  Laterality: N/A;  . Transthoracic echocardiogram  03/30/2012    EF 38-93%: LV systolic function moderately reduced; RA mildly dilated; mild MR; mod TR     Current Outpatient Prescriptions  Medication Sig Dispense Refill  . aspirin EC 81 MG tablet Take 81 mg by mouth daily.    Rodney Moore atorvastatin (LIPITOR) 40 MG tablet Take 40 mg  by mouth at bedtime.     . cetirizine (ZYRTEC) 10 MG tablet Take 10 mg by mouth daily.    Rodney Moore lisinopril (PRINIVIL,ZESTRIL) 10 MG tablet Take 10 mg by mouth daily.    . metoprolol (LOPRESSOR) 50 MG tablet Take 25 mg by mouth 2 (two) times daily.    . naproxen sodium (ALEVE) 220 MG tablet Take 220 mg by mouth as needed.    . pantoprazole (PROTONIX) 40 MG tablet Take 40 mg by mouth daily.    . valACYclovir (VALTREX) 500 MG tablet Take 500 mg by mouth daily.    Rodney Moore zolpidem (AMBIEN) 5 MG tablet Take 5-10 mg by mouth at bedtime.     No current facility-administered medications for this visit.    Allergies:   Penicillins    Social History:  The patient  reports that he quit smoking about 8 years ago. His smoking  use included Cigarettes. He has a 60 pack-year smoking history. He has never used smokeless tobacco. He reports that he drinks alcohol. He reports that he does not use illicit drugs.   Family History:  The patient's family history includes Hypertension in his father.    ROS: .   All other systems are reviewed and negative.Unless otherwise mentioned in  H&P above.   PHYSICAL EXAM: VS:  There were no vitals taken for this visit. , BMI There is no weight on file to calculate BMI. GEN: Well nourished, well developed, in no acute distress HEENT: normal Neck: no JVD, carotid bruits, or masses Cardiac: RRR; no murmurs, rubs, or gallops,no edema  Respiratory:  clear to auscultation bilaterally, normal work of breathing GI: soft, nontender, nondistended, + BS MS: no deformity or atrophy Skin: warm and dry, no rash Neuro:  Strength and sensation are intact Psych: euthymic mood, full affect  Recent Labs: No results found for requested labs within last 365 days.    Lipid Panel No results found for: CHOL, TRIG, HDL, CHOLHDL, VLDL, LDLCALC, LDLDIRECT    Wt Readings from Last 3 Encounters:  10/08/13 166 lb (75.297 kg)  02/22/13 170 lb 8 oz (77.338 kg)      Other studies Reviewed: Additional studies/ records that were reviewed today include: None. Review of the above records demonstrates: N/A   ASSESSMENT AND PLAN:  1.  CAD: history of ischemic cardiomyopathy, with anterior MI.  A drug-eluting stent to the LAD.  Due to recurrent discomfort in his chest and feeling as if his energy has changed.  I will repeat a liver medicine stress test for followup evaluation for progressive heart disease.  The patient did stop smoking 4 years ago.  He is medically compliant.  Labs are being followed by Dr. Willey Moore.  He will be established with Dr. Bronson Moore or Dr.Branch on next visit in order to discuss stress test results.  2. Hypertension: Excellent control of blood pressure.  I make any changes in his  medication regimen at this time.  3. Hypercholesterolemia: he is having myalgias from 40 mg dose of atorvastatin.  I will decrease it to 20 mg daily if he is tolerating that dose.  The patient states he does not have any myalgias when he decreases it on his own.  He is advised on a low cholesterol diet.  He will continue his exercise regimen, which will help with raising HDL in keeping his cholesterol under control.  The fact that he has quit smoking.  He is also very helpful.  Follow-up labs are per his primary care  physician, Dr. Willey Moore.   Current medicines are reviewed at length with the patient today.    Labs/ tests ordered today include: NM Stress Test No orders of the defined types were placed in this encounter.     Disposition:   FU with Dr. Harl Bowie or Dr. Bronson Moore to be est.  Signed, Jory Sims, NP  11/21/2014 7:37 AM    [facility]. [address], [place], [place] [address] Phone: [phone]; Fax: [phone]

## 2014-11-21 NOTE — Progress Notes (Deleted)
Name: Rodney Moore    DOB: 1961/03/29  Age: 54 y.o.  MR#: [id number]       PCP:  Asencion Noble, MD      Insurance: Payor: [place] / Plan: BCBS OTHER / Product Type: *No Product type* /   CC:    Chief Complaint  Patient presents with  . Coronary Artery Disease    DES to LAD.   [name] Hypertension    VS Filed Vitals:   11/21/14 1521  BP: 120/74  Pulse: 69  Height: 6\' 1"  (1.854 m)  Weight: 155 lb (70.308 kg)  SpO2: 98%    Weights Current Weight  11/21/14 155 lb (70.308 kg)  10/08/13 166 lb (75.297 kg)  02/22/13 170 lb 8 oz (77.338 kg)    Blood Pressure  BP Readings from Last 3 Encounters:  11/21/14 120/74  10/08/13 130/83  02/22/13 110/82     Admit date:  (Not on file) Last encounter with RMR:  Visit date not found   Allergy Penicillins  Current Outpatient Prescriptions  Medication Sig Dispense Refill  . ALPRAZolam (XANAX) 0.5 MG tablet     . aspirin EC 81 MG tablet Take 81 mg by mouth daily.    [name] atorvastatin (LIPITOR) 40 MG tablet Take 40 mg by mouth at bedtime.     . cetirizine (ZYRTEC) 10 MG tablet Take 10 mg by mouth daily.    [name] lisinopril (PRINIVIL,ZESTRIL) 10 MG tablet Take 10 mg by mouth daily.    . metoprolol (LOPRESSOR) 50 MG tablet Take 25 mg by mouth 2 (two) times daily.    . naproxen sodium (ALEVE) 220 MG tablet Take 220 mg by mouth as needed.    [name] NITROSTAT 0.4 MG SL tablet     . saw palmetto 160 MG capsule Take 160 mg by mouth 2 (two) times daily.    . valACYclovir (VALTREX) 500 MG tablet Take 500 mg by mouth daily.     No current facility-administered medications for this visit.    Discontinued Meds:    Medications Discontinued During This Encounter  Medication Reason  . pantoprazole (PROTONIX) 40 MG tablet Error  . zolpidem (AMBIEN) 5 MG tablet Error    Patient Active Problem List   Diagnosis Date Noted  . Cardiomyopathy, ischemic 02/22/2013  . Acute MI anterior wall first episode care 02/22/2013  . HTN (hypertension) 02/22/2013   . Dyslipidemia 02/22/2013  . Impaired fasting glucose 02/22/2013    LABS    Component Value Date/Time   NA 140 10/08/2013 1151   K 4.2 10/08/2013 1151   CL 103 10/08/2013 1151   CO2 28 10/08/2013 1151   GLUCOSE 108* 10/08/2013 1151   BUN 11 10/08/2013 1151   CREATININE 0.75 10/08/2013 1151   CALCIUM 9.6 10/08/2013 1151   GFRNONAA >90 10/08/2013 1151   GFRAA >90 10/08/2013 1151   CMP     Component Value Date/Time   NA 140 10/08/2013 1151   K 4.2 10/08/2013 1151   CL 103 10/08/2013 1151   CO2 28 10/08/2013 1151   GLUCOSE 108* 10/08/2013 1151   BUN 11 10/08/2013 1151   CREATININE 0.75 10/08/2013 1151   CALCIUM 9.6 10/08/2013 1151   PROT 7.4 10/08/2013 1151   ALBUMIN 3.8 10/08/2013 1151   AST 15 10/08/2013 1151   ALT 14 10/08/2013 1151   ALKPHOS 65 10/08/2013 1151   BILITOT 0.6 10/08/2013 1151   GFRNONAA >90 10/08/2013 1151   GFRAA >90 10/08/2013 1151  Component Value Date/Time   WBC 5.4 10/08/2013 1151   HGB 13.7 10/08/2013 1151   HCT 39.2 10/08/2013 1151   MCV 93.3 10/08/2013 1151    Lipid Panel  No results found for: CHOL, TRIG, HDL, CHOLHDL, VLDL, LDLCALC, LDLDIRECT  ABG No results found for: PHART, PCO2ART, PO2ART, HCO3, TCO2, ACIDBASEDEF, O2SAT   No results found for: TSH BNP (last 3 results) No results for input(s): BNP in the last 8760 hours.  ProBNP (last 3 results) No results for input(s): PROBNP in the last 8760 hours.  Cardiac Panel (last 3 results) No results for input(s): CKTOTAL, CKMB, TROPONINI, RELINDX in the last 72 hours.  Iron/TIBC/Ferritin/ %Sat No results found for: IRON, TIBC, FERRITIN, IRONPCTSAT   EKG Orders placed or performed during the hospital encounter of 10/08/13  . ED EKG  . ED EKG  . EKG 12-Lead  . EKG 12-Lead  . EKG     Prior Assessment and Plan Problem List as of 11/21/2014      Cardiovascular and Mediastinum   Cardiomyopathy, ischemic   Acute MI anterior wall first episode care   HTN (hypertension)      Endocrine   Impaired fasting glucose     Other   Dyslipidemia       Imaging: No results found.

## 2014-11-30 ENCOUNTER — Encounter (HOSPITAL_COMMUNITY)
Admission: RE | Admit: 2014-11-30 | Discharge: 2014-11-30 | Disposition: A | Discharge status: Home / Self Care | Payer: BLUE CROSS/BLUE SHIELD | Source: Ambulatory Visit | Attending: Adult Health | Admitting: Adult Health

## 2014-11-30 ENCOUNTER — Encounter (HOSPITAL_COMMUNITY): Payer: Self-pay

## 2014-11-30 ENCOUNTER — Inpatient Hospital Stay (HOSPITAL_COMMUNITY): Admission: RE | Admit: 2014-11-30 | Payer: 59 | Source: Ambulatory Visit

## 2014-11-30 DIAGNOSIS — R072 Precordial pain: Secondary | ICD-10-CM | POA: Diagnosis not present

## 2014-11-30 LAB — NM MYOCAR MULTI W/SPECT W/WALL MOTION / EF
CHL CUP MPHR: 167 {beats}/min
CHL CUP RESTING HR STRESS: 62 {beats}/min
Estimated workload: 13.7 METS
Exercise duration (min): 10 min
Exercise duration (sec): 38 s
LV dias vol: 120 mL
LVSYSVOL: 67 mL
NUC STRESS TID: 1.06
Peak HR: 150 {beats}/min
Percent HR: 89 %
RATE: 0
SDS: 3
SRS: 2
SSS: 5

## 2014-11-30 MED ORDER — SODIUM CHLORIDE 0.9 % IJ SOLN
INTRAMUSCULAR | Status: AC
  Filled 2014-11-30: qty 3

## 2014-11-30 MED ORDER — SODIUM CHLORIDE 0.9 % IJ SOLN
10.0000 mL | INTRAMUSCULAR | Status: DC | PRN
  Administered 2014-11-30: 10 mL via INTRAVENOUS
  Filled 2014-11-30: qty 10

## 2014-11-30 MED ORDER — REGADENOSON 0.4 MG/5ML IV SOLN
INTRAVENOUS | Status: AC
  Filled 2014-11-30: qty 5

## 2014-11-30 MED ORDER — TECHNETIUM TC 99M SESTAMIBI GENERIC - CARDIOLITE
30.0000 | Freq: Once | INTRAVENOUS | Status: AC | PRN
  Administered 2014-11-30: 30 via INTRAVENOUS

## 2014-11-30 MED ORDER — SODIUM CHLORIDE 0.9 % IJ SOLN
INTRAMUSCULAR | Status: AC
  Filled 2014-11-30: qty 36

## 2014-11-30 MED ORDER — TECHNETIUM TC 99M SESTAMIBI - CARDIOLITE
10.0000 | Freq: Once | INTRAVENOUS | Status: AC | PRN
  Administered 2014-11-30: 10:00:00 10 via INTRAVENOUS

## 2014-12-14 ENCOUNTER — Ambulatory Visit (INDEPENDENT_AMBULATORY_CARE_PROVIDER_SITE_OTHER): Payer: BLUE CROSS/BLUE SHIELD | Admitting: Otolaryngology

## 2014-12-14 DIAGNOSIS — H903 Sensorineural hearing loss, bilateral: Secondary | ICD-10-CM

## 2015-01-04 ENCOUNTER — Encounter: Payer: Self-pay | Admitting: Cardiovascular Disease

## 2015-01-04 ENCOUNTER — Ambulatory Visit (INDEPENDENT_AMBULATORY_CARE_PROVIDER_SITE_OTHER): Payer: BLUE CROSS/BLUE SHIELD | Admitting: Cardiovascular Disease

## 2015-01-04 VITALS — BP 138/90 | HR 66 | Ht 73.0 in | Wt 159.0 lb

## 2015-01-04 DIAGNOSIS — I2510 Atherosclerotic heart disease of native coronary artery without angina pectoris: Secondary | ICD-10-CM

## 2015-01-04 DIAGNOSIS — I255 Ischemic cardiomyopathy: Secondary | ICD-10-CM | POA: Diagnosis not present

## 2015-01-04 DIAGNOSIS — I10 Essential (primary) hypertension: Secondary | ICD-10-CM | POA: Diagnosis not present

## 2015-01-04 DIAGNOSIS — Z955 Presence of coronary angioplasty implant and graft: Secondary | ICD-10-CM

## 2015-01-04 DIAGNOSIS — Z136 Encounter for screening for cardiovascular disorders: Secondary | ICD-10-CM

## 2015-01-04 DIAGNOSIS — E785 Hyperlipidemia, unspecified: Secondary | ICD-10-CM

## 2015-01-04 NOTE — Patient Instructions (Signed)
Your physician wants you to follow-up in: 1 year with Dr Koneswaran You will receive a reminder letter in the mail two months in advance. If you don't receive a letter, please call our office to schedule the follow-up appointment.    Your physician recommends that you continue on your current medications as directed. Please refer to the Current Medication list given to you today.     Thank you for choosing [facility] !        

## 2015-01-04 NOTE — Progress Notes (Signed)
Patient ID: Rodney Moore, male   DOB: Jun 06, 1961, 54 y.o.   MRN: [id number]      SUBJECTIVE: The patient returns for follow-up after undergoing cardiovascular testing performed for the evaluation of chest pain. This is my first time meeting him. He underwent a nuclear stress test which demonstrated the following:  There was no ST segment deviation noted during stress.  The left ventricular ejection fraction is moderately decreased (30-44%).  This is an overall low to intermediate risk study. Decreased LVEF suggests increased risk, however exercise capacity and Duke treadmill score support low risk  Evidence of prior inferior and anterior wall infarct, no current myocardium at jeopardy.  He has a history of coronary artery disease with a drug-eluting stent placed in the proximal LAD in 2004. He also has an ischemic cardiomyopathy. Echocardiogram in September 2013 demonstrated reduced LV systolic function, EF 76-72%, moderate anterior wall hypokinesis, mild inferior wall hypokinesis, mild mitral regurgitation, and moderate tricuspid regurgitation.  ECG performed in the office today demonstrates normal sinus rhythm, heart rate 57 bpm, with old anteroseptal infarct.  He is doing quite well and denies exertional chest pain and exertional dyspnea. He walks 2-3 miles daily without difficulty. He quit smoking in 2008. He takes Xanax in order to help him sleep 4-6 hours every night. He has significantly changed his dietary habits and eats a lot more fruits and vegetables and less red meat.   Review of Systems: As per "subjective", otherwise negative.  Allergies  Allergen Reactions  . Penicillins Rash    Current Outpatient Prescriptions  Medication Sig Dispense Refill  . ALPRAZolam (XANAX) 0.5 MG tablet     . aspirin EC 81 MG tablet Take 81 mg by mouth daily.    [name] atorvastatin (LIPITOR) 20 MG tablet Take 1 tablet (20 mg total) by mouth at bedtime. 90 tablet 3  . cetirizine (ZYRTEC) 10 MG  tablet Take 10 mg by mouth daily.    [name] lisinopril (PRINIVIL,ZESTRIL) 10 MG tablet Take 10 mg by mouth daily.    . metoprolol (LOPRESSOR) 50 MG tablet Take 25 mg by mouth 2 (two) times daily.    . naproxen sodium (ALEVE) 220 MG tablet Take 220 mg by mouth as needed.    [name] NITROSTAT 0.4 MG SL tablet     . saw palmetto 160 MG capsule Take 160 mg by mouth 2 (two) times daily.    . valACYclovir (VALTREX) 500 MG tablet Take 500 mg by mouth daily.     No current facility-administered medications for this visit.    Past Medical History  Diagnosis Date  . Myocardial infarction 2004    [facility]  . Coronary artery disease   . Hypertension   . Hypercholesteremia   . GERD (gastroesophageal reflux disease)   . Restless leg syndrome   . Kidney stones     recurrent, every year since 1993  . Atelectasis of right lung     history of  . Sleep apnea     STOP BANG score =4  . Ischemic cardiomyopathy     echo 03/2012 EF 40-45%  . History of nuclear stress test 09/2007    exercise myoview; mild perfusion defect in basal/mid inferior walls with mild defect reversibility; EKG negative for ischemia; low risk scan     Past Surgical History  Procedure Laterality Date  . Cardiac catheterization  2004    Cypher stent (3.0x65mm) to prox LAD   . Cholecystectomy    . Chest tube insertion  1991    [facility], [facility], [place]  . Cystoscopy    . Cataract extraction w/phaco  03/18/2012    Procedure: CATARACT EXTRACTION PHACO AND INTRAOCULAR LENS PLACEMENT (IOC);  Surgeon: Tonny Branch, MD;  Location: [facility];  Service: Ophthalmology;  Laterality: Left;  CDE: 3.95  . Colonoscopy  04/05/2012    Procedure: COLONOSCOPY;  Surgeon: Daneil Dolin, MD;  Location: [facility];  Service: Endoscopy;  Laterality: N/A;  . Transthoracic echocardiogram  03/30/2012    EF 91-50%: LV systolic function moderately reduced; RA mildly dilated; mild MR; mod TR    History   Social History  . Marital Status: Married     Spouse Name: N/A  . Number of Children: N/A  . Years of Education: N/A   Occupational History  .     Social History Main Topics  . Smoking status: Former Smoker -- 1.50 packs/day for 40 years    Types: Cigarettes    Quit date: 10/15/2006  . Smokeless tobacco: Never Used  . Alcohol Use: 0.0 oz/week    0 Standard drinks or equivalent per week     Comment: occasional  . Drug Use: No  . Sexual Activity: Not on file   Other Topics Concern  . Not on file   Social History Narrative     Filed Vitals:   01/04/15 0812  BP: 138/90  Pulse: 66  Height: 6\' 1"  (1.854 m)  Weight: 159 lb (72.122 kg)  SpO2: 98%    PHYSICAL EXAM General: NAD HEENT: Normal. Neck: No JVD, no thyromegaly. Lungs: Clear to auscultation bilaterally with normal respiratory effort. CV: Nondisplaced PMI.  Regular rate and rhythm, normal S1/S2, no S3/S4, no murmur. No pretibial or periankle edema.  No carotid bruit.  Normal pedal pulses.  Abdomen: Soft, nontender, no hepatosplenomegaly, no distention.  Neurologic: Alert and oriented x 3.  Psych: Normal affect. Skin: Normal. Musculoskeletal: Normal range of motion, no gross deformities. Extremities: No clubbing or cyanosis.   ECG: Most recent ECG reviewed.      ASSESSMENT AND PLAN: 1. CAD: Stable ischemic heart disease. Continue aspirin, Lipitor, metoprolol, and lisinopril.  2. Essential HTN: Reasonably controlled. No changes to therapy.  3. Hyperlipidemia: Due to have lipids checked in September by PCP. Continue Lipitor 20 mg.  Dispo: f/u 1 year.   Kate Sable, M.D., F.A.C.C.

## 2016-06-24 ENCOUNTER — Telehealth: Payer: Self-pay

## 2016-06-24 DIAGNOSIS — E782 Mixed hyperlipidemia: Secondary | ICD-10-CM

## 2016-06-24 MED ORDER — ATORVASTATIN CALCIUM 40 MG PO TABS: 40.0000 mg | ORAL_TABLET | Freq: Every day | ORAL | 3 refills | Status: DC

## 2016-06-24 NOTE — Telephone Encounter (Signed)
LM to call back,e-scribed increased lipitor dose to [facility],mailed lab slip-cc

## 2016-07-08 ENCOUNTER — Telehealth: Payer: Self-pay

## 2016-07-08 MED ORDER — PRAVASTATIN SODIUM 40 MG PO TABS: 40.0000 mg | ORAL_TABLET | Freq: Every evening | ORAL | 11 refills | Status: DC

## 2016-07-08 NOTE — Telephone Encounter (Signed)
Pt cannot tolerate atorvastatin ,has severe pain in hands.Wants to go back on Pravastatin     Will forward to Dr Roanna Raider Dr Bronson Ing

## 2016-07-08 NOTE — Telephone Encounter (Signed)
He will not entertain lipitor again,I tried

## 2016-07-08 NOTE — Telephone Encounter (Signed)
From notes looks like he did ok on lipitor 20mg  daily. I would favor going back to that dose as opposed to changing to pravastatin unless he thinks he can't tolerate that dose anymore either. Currently we have him listed as taking lipitor 40mg  daily.    Zandra Abts MD

## 2016-07-08 NOTE — Telephone Encounter (Signed)
Ok, can change back to pravastatin. Would start 20mg  daily, can consider going up on dose in the future if he tolerates   J Lashun Ramseyer MD

## 2016-07-08 NOTE — Addendum Note (Signed)
Addended by: Barbarann Ehlers A on: 07/08/2016 04:47 PM   Modules accepted: Orders

## 2016-10-09 ENCOUNTER — Other Ambulatory Visit: Payer: Self-pay | Admitting: Cardiovascular Disease

## 2016-10-10 LAB — HEPATIC FUNCTION PANEL
ALK PHOS: 57 IU/L (ref 39–117)
ALT: 24 IU/L (ref 0–44)
AST: 21 IU/L (ref 0–40)
Albumin: 4.1 g/dL (ref 3.5–5.5)
BILIRUBIN, DIRECT: 0.1 mg/dL (ref 0.00–0.40)
Bilirubin Total: 0.3 mg/dL (ref 0.0–1.2)
Total Protein: 6.7 g/dL (ref 6.0–8.5)

## 2016-10-10 LAB — LIPID PANEL W/O CHOL/HDL RATIO
Cholesterol, Total: 190 mg/dL (ref 100–199)
HDL: 40 mg/dL (ref >39)
LDL Calculated: 109 mg/dL — ABNORMAL HIGH (ref 0–99)
Triglycerides: 207 mg/dL — ABNORMAL HIGH (ref 0–149)
VLDL Cholesterol Cal: 41 mg/dL — ABNORMAL HIGH (ref 5–40)

## 2016-10-10 LAB — AMBIG ABBREV HFP7 DEFAULT

## 2016-11-07 ENCOUNTER — Encounter: Payer: Self-pay | Admitting: Cardiovascular Disease

## 2016-11-07 ENCOUNTER — Ambulatory Visit (INDEPENDENT_AMBULATORY_CARE_PROVIDER_SITE_OTHER): Payer: BLUE CROSS/BLUE SHIELD | Admitting: Cardiovascular Disease

## 2016-11-07 VITALS — BP 118/82 | HR 66 | Ht 73.0 in | Wt 165.0 lb

## 2016-11-07 DIAGNOSIS — I25118 Atherosclerotic heart disease of native coronary artery with other forms of angina pectoris: Secondary | ICD-10-CM | POA: Diagnosis not present

## 2016-11-07 DIAGNOSIS — E782 Mixed hyperlipidemia: Secondary | ICD-10-CM

## 2016-11-07 DIAGNOSIS — I255 Ischemic cardiomyopathy: Secondary | ICD-10-CM

## 2016-11-07 DIAGNOSIS — I10 Essential (primary) hypertension: Secondary | ICD-10-CM | POA: Diagnosis not present

## 2016-11-07 NOTE — Patient Instructions (Signed)
Your physician wants you to follow-up in:  1 year You will receive a reminder letter in the mail two months in advance. If you don't receive a letter, please call our office to schedule the follow-up appointment.   Please get fasting lipids in 4 months ( end of August)   Your physician recommends that you continue on your current medications as directed. Please refer to the Current Medication list given to you today.    If you need a refill on your cardiac medications before your next appointment, please call your pharmacy.     Thank you for choosing [facility] !

## 2016-11-07 NOTE — Progress Notes (Signed)
SUBJECTIVE: The patient presents for follow-up of coronary artery disease, hypertension, and hyperlipidemia. He had drug-eluting stent placement to the proximal LAD in 2004.  Echocardiogram in September 2013 demonstrated reduced LV systolic function, EF 16-10%, moderate anterior wall hypokinesis, mild inferior wall hypokinesis, mild mitral regurgitation, and moderate tricuspid regurgitation.  Nuclear stress test in May 2016 demonstrated the following:  There was no ST segment deviation noted during stress.  The left ventricular ejection fraction is moderately decreased (30-44%).  This is an overall low to intermediate risk study. Decreased LVEF suggests increased risk, however exercise capacity and Duke treadmill score support low risk  Evidence of prior inferior and anterior wall infarct, no current myocardium at jeopardy.  The patient denies any symptoms of chest pain, palpitations, shortness of breath, lightheadedness, dizziness, leg swelling, orthopnea, PND, and syncope.  ECG performed in the office today which I ordered an personally interpreted demonstrated sinus rhythm with old anteroseptal infarct and isolated PVC.  He walks 3 miles daily. He said he went off of his diet to some degree over the winter months.  He had taken Lipitor in the past which led to pain in his hands and dysuria. After switching to pravastatin, these symptoms resolved.  Lipids 10/09/16: Total cholesterol 190, triglycerides 207, HDL 40, LDL 109.    Review of Systems: As per "subjective", otherwise negative.  Allergies  Allergen Reactions  . Lipitor [Atorvastatin] Other (See Comments)    severe pain in hands  . Penicillins Rash    Current Outpatient Prescriptions  Medication Sig Dispense Refill  . ALPRAZolam (XANAX) 0.5 MG tablet 0.5 mg at bedtime.     [name] aspirin EC 81 MG tablet Take 81 mg by mouth daily.    . cetirizine (ZYRTEC) 10 MG tablet Take 10 mg by mouth daily.    [name] lisinopril  (PRINIVIL,ZESTRIL) 10 MG tablet Take 10 mg by mouth daily.    . metoprolol (LOPRESSOR) 50 MG tablet Take 25 mg by mouth 2 (two) times daily.    . naproxen sodium (ALEVE) 220 MG tablet Take 220 mg by mouth as needed.    [name] NITROSTAT 0.4 MG SL tablet     . pravastatin (PRAVACHOL) 40 MG tablet Take 1 tablet (40 mg total) by mouth every evening. 30 tablet 11  . valACYclovir (VALTREX) 500 MG tablet Take 500 mg by mouth daily.     No current facility-administered medications for this visit.     Past Medical History:  Diagnosis Date  . Atelectasis of right lung    history of  . Coronary artery disease   . GERD (gastroesophageal reflux disease)   . History of nuclear stress test 09/2007   exercise myoview; mild perfusion defect in basal/mid inferior walls with mild defect reversibility; EKG negative for ischemia; low risk scan   . Hypercholesteremia   . Hypertension   . Ischemic cardiomyopathy    echo 03/2012 EF 40-45%  . Kidney stones    recurrent, every year since 1993  . Myocardial infarction [facility]) 2004   [facility], [facility]  . Restless leg syndrome   . Sleep apnea    STOP BANG score =4    Past Surgical History:  Procedure Laterality Date  . CARDIAC CATHETERIZATION  2004   Cypher stent (3.0x46mm) to prox LAD   . CATARACT EXTRACTION W/PHACO  03/18/2012   Procedure: CATARACT EXTRACTION PHACO AND INTRAOCULAR LENS PLACEMENT (IOC);  Surgeon: Tonny Branch, MD;  Location: [facility];  Service: Ophthalmology;  Laterality:  Left;  CDE: 3.95  . CHEST TUBE INSERTION  1991   [facility], [facility], [place]  . CHOLECYSTECTOMY    . COLONOSCOPY  04/05/2012   Procedure: COLONOSCOPY;  Surgeon: Daneil Dolin, MD;  Location: [facility];  Service: Endoscopy;  Laterality: N/A;  . CYSTOSCOPY    . TRANSTHORACIC ECHOCARDIOGRAM  03/30/2012   EF 84-66%: LV systolic function moderately reduced; RA mildly dilated; mild MR; mod TR    Social History   Social History  . Marital status: Married    Spouse name: N/A  .  Number of children: N/A  . Years of education: N/A   Occupational History  .  Not Employed   Social History Main Topics  . Smoking status: Former Smoker    Packs/day: 1.50    Years: 40.00    Types: Cigarettes    Quit date: 10/15/2006  . Smokeless tobacco: Never Used  . Alcohol use 0.0 oz/week     Comment: occasional  . Drug use: No  . Sexual activity: Not on file   Other Topics Concern  . Not on file   Social History Narrative  . No narrative on file     Vitals:   11/07/16 1139  BP: 118/82  Pulse: 66  SpO2: 96%  Weight: 165 lb (74.8 kg)  Height: 6\' 1"  (1.854 m)    Wt Readings from Last 3 Encounters:  11/07/16 165 lb (74.8 kg)  01/04/15 159 lb (72.1 kg)  11/21/14 155 lb (70.3 kg)     PHYSICAL EXAM General: NAD HEENT: Normal. Neck: No JVD, no thyromegaly. Lungs: Clear to auscultation bilaterally with normal respiratory effort. CV: Nondisplaced PMI.  Regular rate and rhythm, normal S1/S2, no S3/S4, no murmur. No pretibial or periankle edema.  No carotid bruit.   Abdomen: Soft, nontender, no distention.  Neurologic: Alert and oriented.  Psych: Normal affect. Skin: Normal. Musculoskeletal: No gross deformities.    ECG: Most recent ECG reviewed.   Labs: Lab Results  Component Value Date/Time   K 4.2 10/08/2013 11:51 AM   BUN 11 10/08/2013 11:51 AM   CREATININE 0.75 10/08/2013 11:51 AM   ALT 24 10/09/2016 07:42 AM   HGB 13.7 10/08/2013 11:51 AM     Lipids: Lab Results  Component Value Date/Time   LDLCALC 109 (H) 10/09/2016 07:42 AM   CHOL 190 10/09/2016 07:42 AM   TRIG 207 (H) 10/09/2016 07:42 AM   HDL 40 10/09/2016 07:42 AM       ASSESSMENT AND PLAN:  1. CAD: Symptomatically stable. Continue aspirin, pravastatin, and metoprolol.  2. Essential HTN: Controlled. No changes.  3. Mixed dyslipidemia: Lipids reviewed above. LDL needs further reduction. We talked about increasing pravastatin or adding ezetimibe. He would prefer dietary  modification. I will repeat lipids in 4 months. Continue pravastatin 40 mg.  4. Cardiomyopathy: No signs of heart failure. Continue metoprolol and lisinopril.   Disposition: Follow up 1 yr  Kate Sable, M.D., F.A.C.C.

## 2017-03-13 ENCOUNTER — Other Ambulatory Visit: Payer: Self-pay | Admitting: Cardiovascular Disease

## 2017-03-14 LAB — LIPID PANEL W/O CHOL/HDL RATIO
Cholesterol, Total: 139 mg/dL (ref 100–199)
HDL: 37 mg/dL — AB (ref >39)
LDL Calculated: 87 mg/dL (ref 0–99)
Triglycerides: 73 mg/dL (ref 0–149)
VLDL CHOLESTEROL CAL: 15 mg/dL (ref 5–40)

## 2017-03-17 ENCOUNTER — Telehealth: Payer: Self-pay

## 2017-03-17 NOTE — Telephone Encounter (Signed)
-----   Message from Laurine Blazer, LPN sent at 0/09/1279 11:44 AM EDT -----   ----- Message ----- From: Herminio Commons, MD Sent: 03/17/2017  11:36 AM To: Laurine Blazer, LPN  LDL has declined but needs further reduction. Continue lifestyle change and if he is agreeable, increase pravastatin to 80 mg.

## 2017-03-17 NOTE — Telephone Encounter (Signed)
Pt declined increase dose of pravastatin, he gets severe constipation at higher doses.Pt mentioned adding another pill to take with statin, zetia ?? I told him we would call him back

## 2017-03-18 NOTE — Telephone Encounter (Signed)
That's ok. Continue lifestyle modification.

## 2017-06-26 DIAGNOSIS — E785 Hyperlipidemia, unspecified: Secondary | ICD-10-CM | POA: Diagnosis not present

## 2017-06-26 DIAGNOSIS — Z79899 Other long term (current) drug therapy: Secondary | ICD-10-CM | POA: Diagnosis not present

## 2017-06-26 DIAGNOSIS — I2510 Atherosclerotic heart disease of native coronary artery without angina pectoris: Secondary | ICD-10-CM | POA: Diagnosis not present

## 2017-06-26 DIAGNOSIS — F419 Anxiety disorder, unspecified: Secondary | ICD-10-CM | POA: Diagnosis not present

## 2017-07-03 ENCOUNTER — Other Ambulatory Visit (HOSPITAL_COMMUNITY): Payer: Self-pay | Admitting: Internal Medicine

## 2017-07-03 ENCOUNTER — Ambulatory Visit (HOSPITAL_COMMUNITY)
Admission: RE | Admit: 2017-07-03 | Discharge: 2017-07-03 | Disposition: A | Discharge status: Home / Self Care | Payer: BLUE CROSS/BLUE SHIELD | Source: Ambulatory Visit | Attending: Internal Medicine | Admitting: Internal Medicine

## 2017-07-03 DIAGNOSIS — J449 Chronic obstructive pulmonary disease, unspecified: Secondary | ICD-10-CM | POA: Diagnosis not present

## 2017-07-03 DIAGNOSIS — R0602 Shortness of breath: Secondary | ICD-10-CM

## 2017-07-03 DIAGNOSIS — I2510 Atherosclerotic heart disease of native coronary artery without angina pectoris: Secondary | ICD-10-CM | POA: Diagnosis not present

## 2017-07-08 ENCOUNTER — Other Ambulatory Visit: Payer: Self-pay | Admitting: Cardiology

## 2017-12-15 DIAGNOSIS — H1712 Central corneal opacity, left eye: Secondary | ICD-10-CM | POA: Diagnosis not present

## 2017-12-15 DIAGNOSIS — H52223 Regular astigmatism, bilateral: Secondary | ICD-10-CM | POA: Diagnosis not present

## 2017-12-15 DIAGNOSIS — H5203 Hypermetropia, bilateral: Secondary | ICD-10-CM | POA: Diagnosis not present

## 2017-12-15 DIAGNOSIS — H524 Presbyopia: Secondary | ICD-10-CM | POA: Diagnosis not present

## 2018-01-01 DIAGNOSIS — I2510 Atherosclerotic heart disease of native coronary artery without angina pectoris: Secondary | ICD-10-CM | POA: Diagnosis not present

## 2018-01-01 DIAGNOSIS — J449 Chronic obstructive pulmonary disease, unspecified: Secondary | ICD-10-CM | POA: Diagnosis not present

## 2018-01-01 DIAGNOSIS — Z6820 Body mass index (BMI) 20.0-20.9, adult: Secondary | ICD-10-CM | POA: Diagnosis not present

## 2018-04-23 DIAGNOSIS — Z23 Encounter for immunization: Secondary | ICD-10-CM | POA: Diagnosis not present

## 2018-06-04 ENCOUNTER — Other Ambulatory Visit: Payer: Self-pay | Admitting: Cardiology

## 2018-06-09 DIAGNOSIS — J449 Chronic obstructive pulmonary disease, unspecified: Secondary | ICD-10-CM | POA: Diagnosis not present

## 2018-06-09 DIAGNOSIS — I2510 Atherosclerotic heart disease of native coronary artery without angina pectoris: Secondary | ICD-10-CM | POA: Diagnosis not present

## 2018-06-09 DIAGNOSIS — Z79899 Other long term (current) drug therapy: Secondary | ICD-10-CM | POA: Diagnosis not present

## 2018-06-09 DIAGNOSIS — Z125 Encounter for screening for malignant neoplasm of prostate: Secondary | ICD-10-CM | POA: Diagnosis not present

## 2018-06-18 DIAGNOSIS — I2510 Atherosclerotic heart disease of native coronary artery without angina pectoris: Secondary | ICD-10-CM | POA: Diagnosis not present

## 2018-06-18 DIAGNOSIS — E785 Hyperlipidemia, unspecified: Secondary | ICD-10-CM | POA: Diagnosis not present

## 2018-07-02 ENCOUNTER — Other Ambulatory Visit: Payer: Self-pay | Admitting: Cardiology

## 2018-08-04 ENCOUNTER — Other Ambulatory Visit: Payer: Self-pay | Admitting: Cardiology

## 2018-09-14 DIAGNOSIS — E785 Hyperlipidemia, unspecified: Secondary | ICD-10-CM | POA: Diagnosis not present

## 2018-09-14 DIAGNOSIS — I2510 Atherosclerotic heart disease of native coronary artery without angina pectoris: Secondary | ICD-10-CM | POA: Diagnosis not present

## 2018-09-21 DIAGNOSIS — I2510 Atherosclerotic heart disease of native coronary artery without angina pectoris: Secondary | ICD-10-CM | POA: Diagnosis not present

## 2018-09-21 DIAGNOSIS — E785 Hyperlipidemia, unspecified: Secondary | ICD-10-CM | POA: Diagnosis not present

## 2018-09-21 DIAGNOSIS — Z6820 Body mass index (BMI) 20.0-20.9, adult: Secondary | ICD-10-CM | POA: Diagnosis not present

## 2018-11-10 ENCOUNTER — Ambulatory Visit: Payer: BLUE CROSS/BLUE SHIELD | Admitting: Cardiovascular Disease

## 2018-11-26 IMAGING — DX DG CHEST 2V
2 series · 2 of 2 positions shown · non-contrast
Comparison: 10/08/2013.

CLINICAL DATA: Shortness of breath and dyspnea for the past 3 days.
Ex-smoker.

EXAM:
CHEST  2 VIEW

[chest pa]
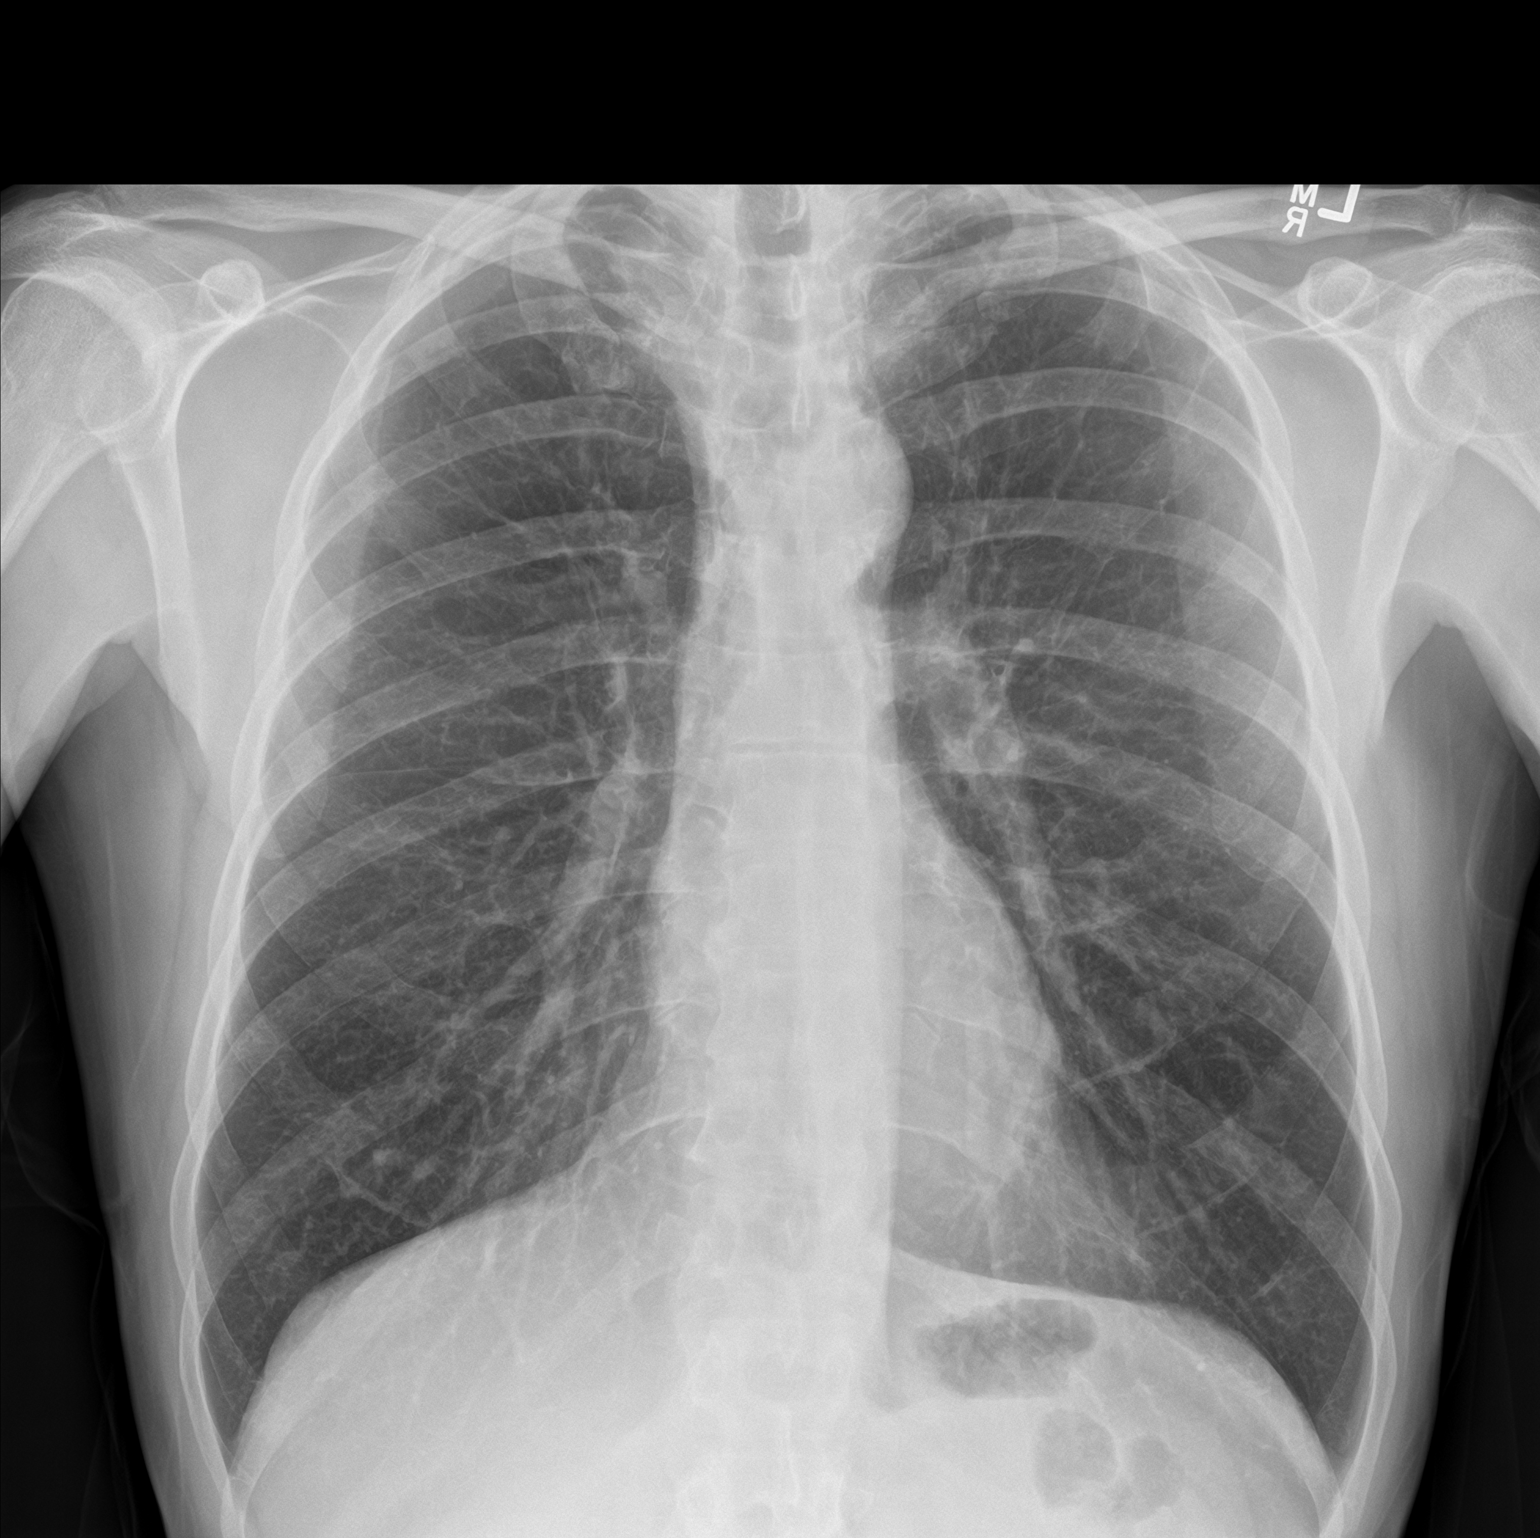

[chest lat]
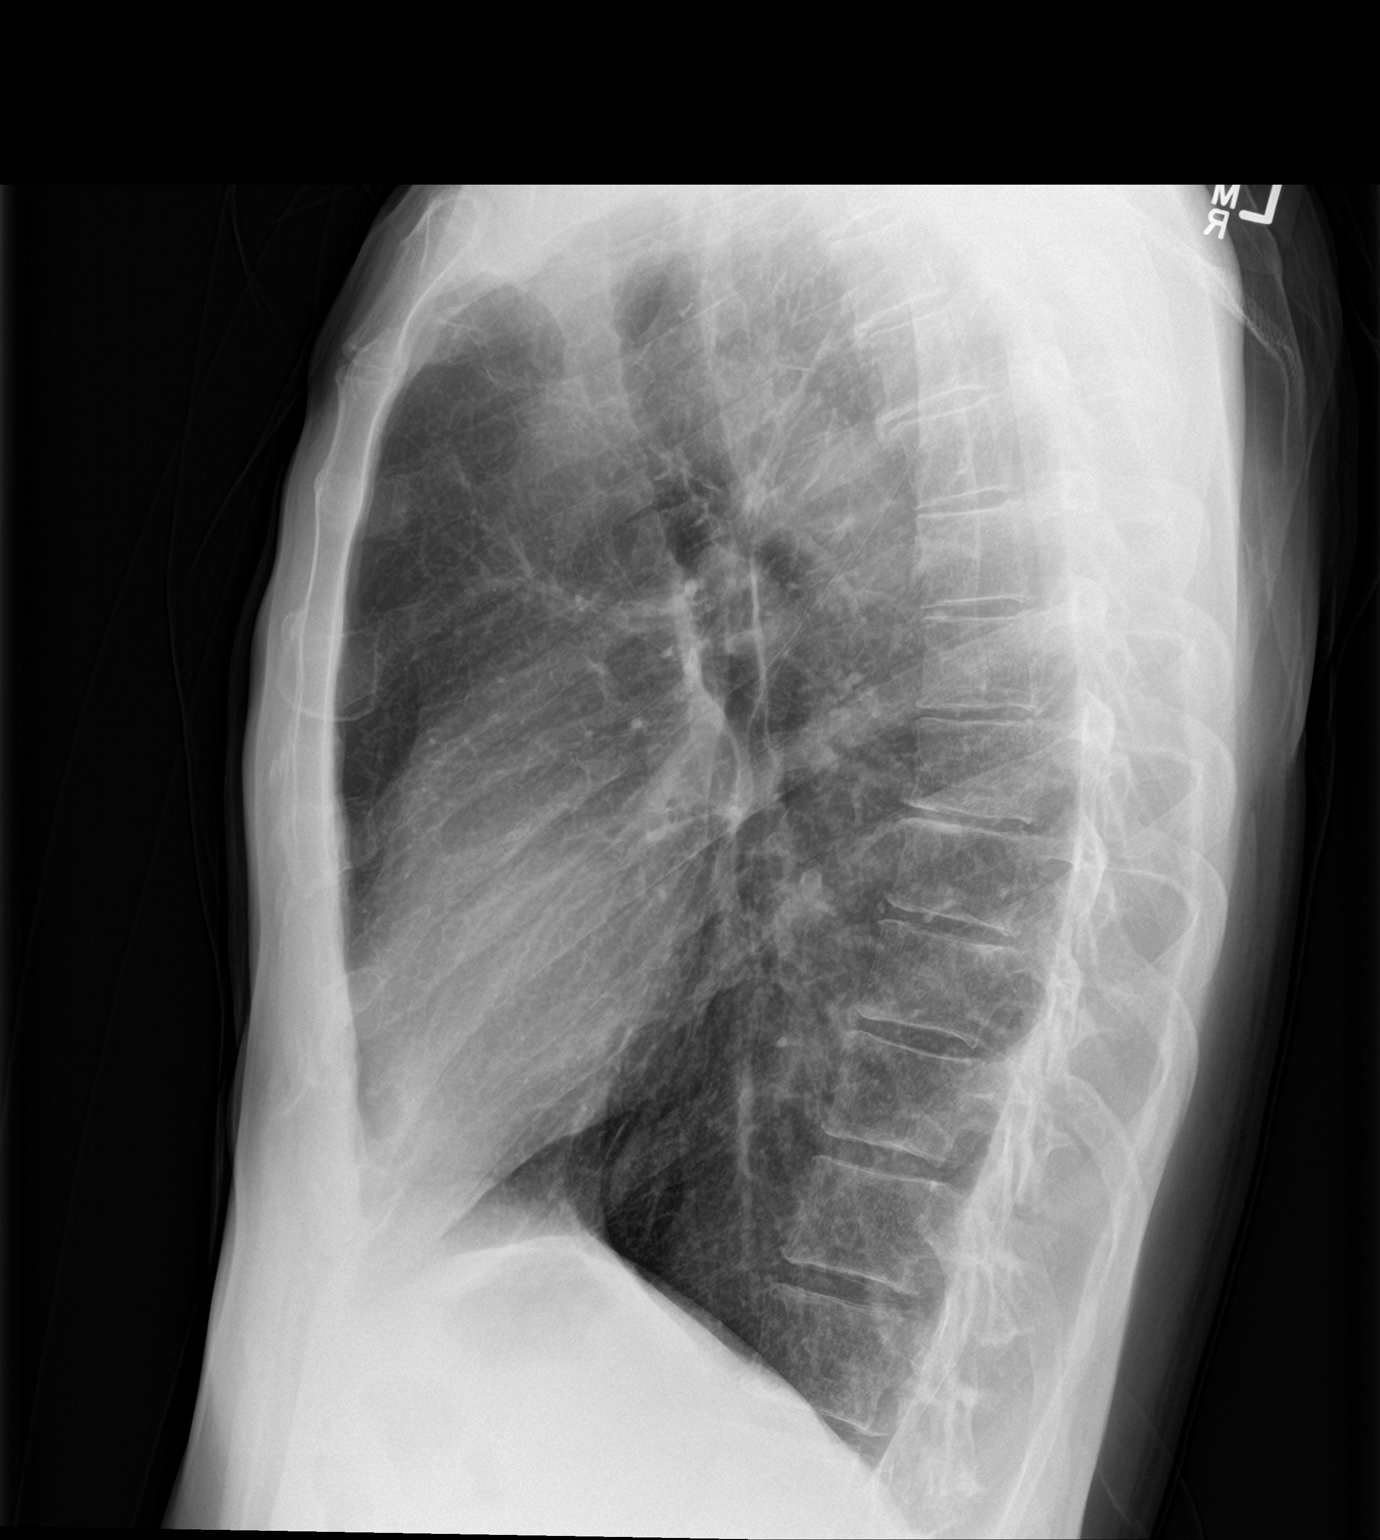

[2 of 2 positions shown; findings below may reference images not displayed]

FINDINGS: Normal sized heart. Clear lungs. The lungs are hyperexpanded with
mild diffuse peribronchial thickening. Mild thoracic spine
degenerative changes.
IMPRESSION: Changes of COPD and chronic bronchitis.  No acute abnormality.

## 2019-03-04 ENCOUNTER — Other Ambulatory Visit: Payer: Self-pay | Admitting: Cardiology

## 2019-03-07 ENCOUNTER — Ambulatory Visit: Payer: BLUE CROSS/BLUE SHIELD | Admitting: Cardiovascular Disease

## 2019-04-01 ENCOUNTER — Ambulatory Visit: Payer: BLUE CROSS/BLUE SHIELD | Admitting: Cardiovascular Disease

## 2019-04-04 ENCOUNTER — Other Ambulatory Visit: Payer: Self-pay | Admitting: Cardiology

## 2019-04-11 DIAGNOSIS — Z23 Encounter for immunization: Secondary | ICD-10-CM | POA: Diagnosis not present

## 2019-08-19 ENCOUNTER — Encounter: Payer: Self-pay | Admitting: Cardiovascular Disease

## 2019-08-19 ENCOUNTER — Other Ambulatory Visit: Payer: Self-pay

## 2019-08-19 ENCOUNTER — Ambulatory Visit (INDEPENDENT_AMBULATORY_CARE_PROVIDER_SITE_OTHER): Payer: BC Managed Care – PPO | Admitting: Cardiovascular Disease

## 2019-08-19 VITALS — BP 123/74 | HR 63 | Temp 98.9°F | Ht 73.0 in | Wt 157.0 lb

## 2019-08-19 DIAGNOSIS — I255 Ischemic cardiomyopathy: Secondary | ICD-10-CM

## 2019-08-19 DIAGNOSIS — I25118 Atherosclerotic heart disease of native coronary artery with other forms of angina pectoris: Secondary | ICD-10-CM | POA: Diagnosis not present

## 2019-08-19 DIAGNOSIS — E785 Hyperlipidemia, unspecified: Secondary | ICD-10-CM

## 2019-08-19 DIAGNOSIS — I10 Essential (primary) hypertension: Secondary | ICD-10-CM | POA: Diagnosis not present

## 2019-08-19 DIAGNOSIS — Z955 Presence of coronary angioplasty implant and graft: Secondary | ICD-10-CM | POA: Diagnosis not present

## 2019-08-19 NOTE — Addendum Note (Signed)
Addended by: Barbarann Ehlers A on: 08/19/2019 03:02 PM   Modules accepted: Orders

## 2019-08-19 NOTE — Progress Notes (Signed)
SUBJECTIVE: The patient presents for past due follow-up.  He has a history of coronary artery disease, hypertension, and hyperlipidemia.  He had a drug-eluting stent placed to the proximal LAD in 2004.  Echocardiogram in September 2013 demonstrated reduced LV systolic function, EF A999333, moderate anterior wall hypokinesis, mild inferior wall hypokinesis, mild mitral regurgitation, and moderate tricuspid regurgitation.  Nuclear stress test in May 2016 demonstrated the following:  There was no ST segment deviation noted during stress.  The left ventricular ejection fraction is moderately decreased (30-44%).  This is an overall low to intermediate risk study. Decreased LVEF suggests increased risk, however exercise capacity and Duke treadmill score support low risk  Evidence of prior inferior and anterior wall infarct, no current myocardium at jeopardy.  The patient denies any symptoms of chest pain, palpitations, shortness of breath, lightheadedness, dizziness, leg swelling, orthopnea, PND, and syncope.  He walks 3 to 4 miles every day and tries to run at least once a month to assess his exertional symptoms.  At the time of his heart attack he had upper right-sided chest pain.  Other than this he has occasional abdominal pain related to irritable bowel syndrome.  He brought his labs from 09/14/2018 which I reviewed: Total cholesterol 169, LDL 104, HDL 45, triglycerides 99.  He did not tolerate ezetimibe.  ECG performed in the office today which I personally view demonstrates sinus rhythm with sinus arrhythmia and old anteroseptal infarct.      Review of Systems: As per "subjective", otherwise negative.  Allergies  Allergen Reactions  . Lipitor [Atorvastatin] Other (See Comments)    severe pain in hands  . Penicillins Rash    Current Outpatient Medications  Medication Sig Dispense Refill  . ALPRAZolam (XANAX) 0.5 MG tablet 0.5 mg at bedtime.     [name] aspirin EC 81 MG  tablet Take 81 mg by mouth daily.    . cetirizine (ZYRTEC) 10 MG tablet Take 10 mg by mouth daily.    [name] lisinopril (PRINIVIL,ZESTRIL) 10 MG tablet Take 10 mg by mouth daily.    . metoprolol (LOPRESSOR) 50 MG tablet Take 25 mg by mouth 2 (two) times daily.    . naproxen sodium (ALEVE) 220 MG tablet Take 220 mg by mouth as needed.    [name] NITROSTAT 0.4 MG SL tablet     . pravastatin (PRAVACHOL) 40 MG tablet TAKE 1 TABLET IN THE EVENING. 30 tablet 6  . valACYclovir (VALTREX) 500 MG tablet Take 500 mg by mouth daily.     No current facility-administered medications for this visit.    Past Medical History:  Diagnosis Date  . Atelectasis of right lung    history of  . Coronary artery disease   . GERD (gastroesophageal reflux disease)   . History of nuclear stress test 09/2007   exercise myoview; mild perfusion defect in basal/mid inferior walls with mild defect reversibility; EKG negative for ischemia; low risk scan   . Hypercholesteremia   . Hypertension   . Ischemic cardiomyopathy    echo 03/2012 EF 40-45%  . Kidney stones    recurrent, every year since 1993  . Myocardial infarction [facility]) 2004   [facility]  . Restless leg syndrome   . Sleep apnea    STOP BANG score =4    Past Surgical History:  Procedure Laterality Date  . CARDIAC CATHETERIZATION  2004   Cypher stent (3.0x71mm) to prox LAD   . CATARACT EXTRACTION W/PHACO  03/18/2012  Procedure: CATARACT EXTRACTION PHACO AND INTRAOCULAR LENS PLACEMENT (IOC);  Surgeon: Tonny Branch, MD;  Location: [facility] ORS;  Service: Ophthalmology;  Laterality: Left;  CDE: 3.95  . CHEST TUBE INSERTION  1991   [facility], [facility], [place]  . CHOLECYSTECTOMY    . COLONOSCOPY  04/05/2012   Procedure: COLONOSCOPY;  Surgeon: Daneil Dolin, MD;  Location: [facility] ENDO SUITE;  Service: Endoscopy;  Laterality: N/A;  . CYSTOSCOPY    . TRANSTHORACIC ECHOCARDIOGRAM  03/30/2012   EF A999333: LV systolic function moderately reduced; RA mildly dilated; mild MR; mod TR     Social History   Socioeconomic History  . Marital status: Married    Spouse name: Not on file  . Number of children: Not on file  . Years of education: Not on file  . Highest education level: Not on file  Occupational History    Employer: NOT EMPLOYED  Tobacco Use  . Smoking status: Former Smoker    Packs/day: 1.50    Years: 40.00    Pack years: 60.00    Types: Cigarettes    Quit date: 10/15/2006    Years since quitting: 12.8  . Smokeless tobacco: Never Used  Substance and Sexual Activity  . Alcohol use: Yes    Alcohol/week: 0.0 standard drinks    Comment: occasional  . Drug use: No  . Sexual activity: Not on file  Other Topics Concern  . Not on file  Social History Narrative  . Not on file   Social Determinants of Health   Financial Resource Strain:   . Difficulty of Paying Living Expenses: Not on file  Food Insecurity:   . Worried About Charity fundraiser in the Last Year: Not on file  . Ran Out of Food in the Last Year: Not on file  Transportation Needs:   . Lack of Transportation (Medical): Not on file  . Lack of Transportation (Non-Medical): Not on file  Physical Activity:   . Days of Exercise per Week: Not on file  . Minutes of Exercise per Session: Not on file  Stress:   . Feeling of Stress : Not on file  Social Connections:   . Frequency of Communication with Friends and Family: Not on file  . Frequency of Social Gatherings with Friends and Family: Not on file  . Attends Religious Services: Not on file  . Active Member of Clubs or Organizations: Not on file  . Attends Archivist Meetings: Not on file  . Marital Status: Not on file  Intimate Partner Violence:   . Fear of Current or Ex-Partner: Not on file  . Emotionally Abused: Not on file  . Physically Abused: Not on file  . Sexually Abused: Not on file     Vitals:   08/19/19 1339  BP: 123/74  Pulse: 63  Temp: 98.9 F (37.2 C)  SpO2: 98%  Weight: 157 lb (71.2 kg)  Height: 6'  1" (1.854 m)    Wt Readings from Last 3 Encounters:  08/19/19 157 lb (71.2 kg)  11/07/16 165 lb (74.8 kg)  01/04/15 159 lb (72.1 kg)     PHYSICAL EXAM General: NAD HEENT: Normal. Neck: No JVD, no thyromegaly. Lungs: Clear to auscultation bilaterally with normal respiratory effort. CV: Regular rate and rhythm, normal S1/S2, no S3/S4, no murmur. No pretibial or periankle edema.  No carotid bruit.   Abdomen: Soft, nontender, no distention.  Neurologic: Alert and oriented.  Psych: Normal affect. Skin: Normal. Musculoskeletal: No gross deformities.  Labs: Lab Results  Component Value Date/Time   K 4.2 10/08/2013 11:51 AM   BUN 11 10/08/2013 11:51 AM   CREATININE 0.75 10/08/2013 11:51 AM   ALT 24 10/09/2016 07:42 AM   HGB 13.7 10/08/2013 11:51 AM     Lipids: Lab Results  Component Value Date/Time   LDLCALC 87 03/13/2017 07:44 AM   CHOL 139 03/13/2017 07:44 AM   TRIG 73 03/13/2017 07:44 AM   HDL 37 (L) 03/13/2017 07:44 AM       ASSESSMENT AND PLAN:  1.  Coronary artery disease: Symptomatically stable.  Exercises regularly.  History of LAD stent.  Continue aspirin, pravastatin, and metoprolol.  2.  Hypertension: Controlled on present therapy.  No changes.  3.  Hyperlipidemia: Lipids reviewed above from March 2020.  Currently on pravastatin 40 mg.  He did not tolerate ezetimibe.  I discussed PCSK9 inhibitors with him and he would prefer to think about it.  4.  Cardiomyopathy: No signs of heart failure.  Currently on metoprolol and lisinopril.  I will obtain a follow-up echocardiogram.    Disposition: Follow up 1 yr    Kate Sable, M.D., F.A.C.C.

## 2019-08-19 NOTE — Patient Instructions (Signed)
Medication Instructions:  Your physician recommends that you continue on your current medications as directed. Please refer to the Current Medication list given to you today.  *If you need a refill on your cardiac medications before your next appointment, please call your pharmacy*  Lab Work: None today If you have labs (blood work) drawn today and your tests are completely normal, you will receive your results only by: [name] MyChart Message (if you have MyChart) OR . A paper copy in the mail If you have any lab test that is abnormal or we need to change your treatment, we will call you to review the results.  Testing/Procedures: Your physician has requested that you have an echocardiogram. Echocardiography is a painless test that uses sound waves to create images of your heart. It provides your doctor with information about the size and shape of your heart and how well your heart's chambers and valves are working. This procedure takes approximately one hour. There are no restrictions for this procedure.    Follow-Up: At [facility], you and your health needs are our priority.  As part of our continuing mission to provide you with exceptional heart care, we have created designated Provider Care Teams.  These Care Teams include your primary Cardiologist (physician) and Advanced Practice Providers (APPs -  Physician Assistants and Nurse Practitioners) who all work together to provide you with the care you need, when you need it.  Your next appointment:   12 month(s)  The format for your next appointment:   In Person  Provider:   Kate Sable, MD  Other Instructions     Thank you for choosing [facility] !

## 2019-08-22 DIAGNOSIS — I2510 Atherosclerotic heart disease of native coronary artery without angina pectoris: Secondary | ICD-10-CM | POA: Diagnosis not present

## 2019-08-22 DIAGNOSIS — F411 Generalized anxiety disorder: Secondary | ICD-10-CM | POA: Diagnosis not present

## 2019-08-25 ENCOUNTER — Ambulatory Visit (HOSPITAL_COMMUNITY)
Admission: RE | Admit: 2019-08-25 | Discharge: 2019-08-25 | Disposition: A | Discharge status: Home / Self Care | Payer: BC Managed Care – PPO | Source: Ambulatory Visit | Attending: Cardiovascular Disease | Admitting: Cardiovascular Disease

## 2019-08-25 ENCOUNTER — Other Ambulatory Visit: Payer: Self-pay

## 2019-08-25 DIAGNOSIS — I255 Ischemic cardiomyopathy: Secondary | ICD-10-CM | POA: Diagnosis not present

## 2019-08-25 NOTE — Progress Notes (Signed)
*  PRELIMINARY RESULTS* Echocardiogram 2D Echocardiogram has been performed.  Rodney Moore 08/25/2019, 9:23 AM

## 2019-08-26 ENCOUNTER — Telehealth: Payer: Self-pay | Admitting: Cardiovascular Disease

## 2019-08-26 NOTE — Telephone Encounter (Signed)
Returning call for results  °

## 2019-08-26 NOTE — Telephone Encounter (Signed)
Results given to patient earlier, he forgot

## 2019-09-20 DIAGNOSIS — Z23 Encounter for immunization: Secondary | ICD-10-CM | POA: Diagnosis not present

## 2019-10-25 ENCOUNTER — Other Ambulatory Visit: Payer: Self-pay | Admitting: Cardiology

## 2020-02-13 DIAGNOSIS — Z79899 Other long term (current) drug therapy: Secondary | ICD-10-CM | POA: Diagnosis not present

## 2020-02-13 DIAGNOSIS — Z125 Encounter for screening for malignant neoplasm of prostate: Secondary | ICD-10-CM | POA: Diagnosis not present

## 2020-02-13 DIAGNOSIS — F419 Anxiety disorder, unspecified: Secondary | ICD-10-CM | POA: Diagnosis not present

## 2020-02-13 DIAGNOSIS — I2510 Atherosclerotic heart disease of native coronary artery without angina pectoris: Secondary | ICD-10-CM | POA: Diagnosis not present

## 2020-02-20 DIAGNOSIS — I2510 Atherosclerotic heart disease of native coronary artery without angina pectoris: Secondary | ICD-10-CM | POA: Diagnosis not present

## 2020-02-20 DIAGNOSIS — E785 Hyperlipidemia, unspecified: Secondary | ICD-10-CM | POA: Diagnosis not present

## 2020-02-20 DIAGNOSIS — Z6821 Body mass index (BMI) 21.0-21.9, adult: Secondary | ICD-10-CM | POA: Diagnosis not present

## 2020-04-30 DIAGNOSIS — Z23 Encounter for immunization: Secondary | ICD-10-CM | POA: Diagnosis not present

## 2020-05-07 ENCOUNTER — Other Ambulatory Visit: Payer: Self-pay | Admitting: Cardiology

## 2020-08-09 ENCOUNTER — Other Ambulatory Visit: Payer: Self-pay

## 2020-08-09 ENCOUNTER — Encounter: Payer: Self-pay | Admitting: *Deleted

## 2020-08-09 ENCOUNTER — Ambulatory Visit (INDEPENDENT_AMBULATORY_CARE_PROVIDER_SITE_OTHER): Payer: BC Managed Care – PPO | Admitting: Student

## 2020-08-09 ENCOUNTER — Encounter: Payer: Self-pay | Admitting: Student

## 2020-08-09 VITALS — BP 108/60 | HR 69 | Ht 73.0 in | Wt 155.0 lb

## 2020-08-09 DIAGNOSIS — I10 Essential (primary) hypertension: Secondary | ICD-10-CM | POA: Diagnosis not present

## 2020-08-09 DIAGNOSIS — Z8679 Personal history of other diseases of the circulatory system: Secondary | ICD-10-CM

## 2020-08-09 DIAGNOSIS — I2510 Atherosclerotic heart disease of native coronary artery without angina pectoris: Secondary | ICD-10-CM

## 2020-08-09 DIAGNOSIS — E785 Hyperlipidemia, unspecified: Secondary | ICD-10-CM | POA: Diagnosis not present

## 2020-08-09 NOTE — Progress Notes (Signed)
Cardiology Office Note    Date:  08/09/2020   ID:  Rodney, Moore [birth year], MRN [id number]  PCP:  Asencion Noble, MD  Cardiologist: Previously followed by Dr. Bronson Ing --> Needs to switch to new MD  Chief Complaint  Patient presents with  . Follow-up    Annual Visit    History of Present Illness:    Rodney Moore is a 60 y.o. male with past medical history of CAD (s/p DES to proximal LAD in 2004, low-risk NST in 11/2014), history of cardiomyopathy (EF previously 40-45% in 2013, normalized to 55-60% by repeat imaging in 08/2019), HTN and HLD who presents to the office today for annual follow-up.   He was last examined by Dr. Bronson Ing in 08/2019 and denied any recent chest pain or dyspnea at that time. He was walking for 3 to 4 miles each day without any anginal symptoms. He was continued on ASA, Pravastatin 40 mg daily and Lopressor 25 mg twice daily. LDL was above goal but he had been intolerant to Zetia and was not interested in PCSK9 inhibitor therapy at that time. A repeat echocardiogram was recommended given the timeframe since his last study and this showed that his EF had improved to 55 to 60%. He did have akinesis of the mid to distal anterior septal wall and the mid to distal anterior wall and apex were hypokinetic. RV function was normal and he did not have any significant valve abnormalities.  In talking with the patient today, he reports overall doing well from a cardiac perspective since his last visit. He continues to walk for 3 to 5 miles on a daily basis either in his neighborhood or on the treadmill. He denies any recent chest pain or dyspnea on exertion with these activities. No recent orthopnea, PND, lower extremity edema or palpitations.   Past Medical History:  Diagnosis Date  . Atelectasis of right lung    history of  . Coronary artery disease   . GERD (gastroesophageal reflux disease)   . History of nuclear stress test 09/2007   exercise myoview; mild  perfusion defect in basal/mid inferior walls with mild defect reversibility; EKG negative for ischemia; low risk scan   . Hypercholesteremia   . Hypertension   . Ischemic cardiomyopathy    echo 03/2012 EF 40-45%  . Kidney stones    recurrent, every year since 1993  . Myocardial infarction [facility]) 2004   [facility]  . Restless leg syndrome   . Sleep apnea    STOP BANG score =4    Past Surgical History:  Procedure Laterality Date  . CARDIAC CATHETERIZATION  2004   Cypher stent (3.0x77mm) to prox LAD   . CATARACT EXTRACTION W/PHACO  03/18/2012   Procedure: CATARACT EXTRACTION PHACO AND INTRAOCULAR LENS PLACEMENT (IOC);  Surgeon: Tonny Branch, MD;  Location: [facility] ORS;  Service: Ophthalmology;  Laterality: Left;  CDE: 3.95  . CHEST TUBE INSERTION  1991   [facility], [facility], [place]  . CHOLECYSTECTOMY    . COLONOSCOPY  04/05/2012   Procedure: COLONOSCOPY;  Surgeon: Daneil Dolin, MD;  Location: [facility];  Service: Endoscopy;  Laterality: N/A;  . CYSTOSCOPY    . TRANSTHORACIC ECHOCARDIOGRAM  03/30/2012   EF A999333: LV systolic function moderately reduced; RA mildly dilated; mild MR; mod TR    Current Medications: Outpatient Medications Prior to Visit  Medication Sig Dispense Refill  . ALPRAZolam (XANAX) 0.5 MG tablet 0.5 mg at bedtime.     [name]  aspirin EC 81 MG tablet Take 81 mg by mouth daily.    . cetirizine (ZYRTEC) 10 MG tablet Take 10 mg by mouth daily.    [name] lisinopril (PRINIVIL,ZESTRIL) 10 MG tablet Take 10 mg by mouth daily.    . metoprolol (LOPRESSOR) 50 MG tablet Take 25 mg by mouth 2 (two) times daily.    . naproxen sodium (ALEVE) 220 MG tablet Take 220 mg by mouth as needed.    [name] NITROSTAT 0.4 MG SL tablet     . pravastatin (PRAVACHOL) 40 MG tablet TAKE 1 TABLET EVERY EVENING. 30 tablet 6  . valACYclovir (VALTREX) 500 MG tablet Take 500 mg by mouth daily.     No facility-administered medications prior to visit.     Allergies:   Lipitor [atorvastatin], Crestor  [rosuvastatin calcium], Zetia [ezetimibe], and Penicillins   Social History   Socioeconomic History  . Marital status: Married    Spouse name: Not on file  . Number of children: Not on file  . Years of education: Not on file  . Highest education level: Not on file  Occupational History    Employer: NOT EMPLOYED  Tobacco Use  . Smoking status: Former Smoker    Packs/day: 1.50    Years: 40.00    Pack years: 60.00    Types: Cigarettes    Quit date: 10/15/2006    Years since quitting: 13.8  . Smokeless tobacco: Never Used  Vaping Use  . Vaping Use: Never used  Substance and Sexual Activity  . Alcohol use: Yes    Alcohol/week: 0.0 standard drinks    Comment: occasional  . Drug use: No  . Sexual activity: Not on file  Other Topics Concern  . Not on file  Social History Narrative  . Not on file   Social Determinants of Health   Financial Resource Strain: Not on file  Food Insecurity: Not on file  Transportation Needs: Not on file  Physical Activity: Not on file  Stress: Not on file  Social Connections: Not on file     Family History:  The patient's family history includes Hypertension in his father.   Review of Systems:   Please see the history of present illness.     General:  No chills, fever, night sweats or weight changes. Positive for joint pain.  Cardiovascular:  No chest pain, dyspnea on exertion, edema, orthopnea, palpitations, paroxysmal nocturnal dyspnea. Dermatological: No rash, lesions/masses Respiratory: No cough, dyspnea Urologic: No hematuria, dysuria Abdominal:   No nausea, vomiting, diarrhea, bright red blood per rectum, melena, or hematemesis Neurologic:  No visual changes, wkns, changes in mental status. All other systems reviewed and are otherwise negative except as noted above.   Physical Exam:    VS:  BP 108/60   Pulse 69   Ht 6\' 1"  (1.854 m)   Wt 155 lb (70.3 kg)   SpO2 97%   BMI 20.45 kg/m    General: Well developed, well  nourished,male appearing in no acute distress. Head: Normocephalic, atraumatic. Neck: No carotid bruits. JVD not elevated.  Lungs: Respirations regular and unlabored, without wheezes or rales.  Heart: Regular rate and rhythm. No S3 or S4.  No murmur, no rubs, or gallops appreciated. Abdomen: Appears non-distended. No obvious abdominal masses. Msk:  Strength and tone appear normal for age. No obvious joint deformities or effusions. Extremities: No clubbing or cyanosis. No edema.  Distal pedal pulses are 2+ bilaterally. Neuro: Alert and oriented X 3. Moves all extremities spontaneously. No focal  deficits noted. Psych:  Responds to questions appropriately with a normal affect. Skin: No rashes or lesions noted  Wt Readings from Last 3 Encounters:  08/09/20 155 lb (70.3 kg)  08/19/19 157 lb (71.2 kg)  11/07/16 165 lb (74.8 kg)      Studies/Labs Reviewed:   EKG:  EKG is ordered today. The ekg ordered today demonstrates NSR, HR 69 with no acute ST changes when compared to prior tracings.   Recent Labs: No results found for requested labs within last 8760 hours.   Lipid Panel    Component Value Date/Time   CHOL 139 03/13/2017 0744   TRIG 73 03/13/2017 0744   HDL 37 (L) 03/13/2017 0744   LDLCALC 87 03/13/2017 0744    Additional studies/ records that were reviewed today include:   NST: 11/2014  There was no ST segment deviation noted during stress.  The left ventricular ejection fraction is moderately decreased (30-44%).  This is an overall low to intermediate risk study. Decreased LVEF suggests increased risk, however exercise capacity and Duke treadmill score support low risk  Evidence of prior inferior and anterior wall infarct, no current myocardium at jeopardy    Echocardiogram: 08/2019 IMPRESSIONS    1. The mid to distal anteroseptal wall is akinetic. The mid to distal  anterior wall and apex are hypokinetic. [name] Left ventricular ejection  fraction, by estimation, is  55 to 60%. The left ventricle has normal  function. The left ventrical demonstrates  regional wall motion abnormalities (see scoring diagram/findings for  description). Left ventricular diastolic parameters were normal.  2. Right ventricular systolic function is normal. The right ventricular  size is normal.  3. The mitral valve is normal in structure and function. no evidence of  mitral valve regurgitation. No evidence of mitral stenosis.  4. The aortic valve is tricuspid. Aortic valve regurgitation is not  visualized. No aortic stenosis is present.  5. The inferior vena cava is dilated in size with >50% respiratory  variability, suggesting right atrial pressure of 8 mmHg.     Assessment:    1. Coronary artery disease involving native coronary artery of native heart without angina pectoris   2. History of cardiomyopathy   3. Essential hypertension   4. Hyperlipidemia LDL goal <70      Plan:   In order of problems listed above:  1. CAD - He is s/p DES to proximal LAD in 2004 with a low-risk NST in 11/2014. He remains very active at baseline as outlined above and denies any recent anginal symptoms. - Continue current medication regimen with ASA 81 mg daily, Lopressor 25 mg twice daily and Pravastatin 40 mg daily.  2. History of Cardiomyopathy - His EF was previously reduced to 40-45% in 2013, normalized to 55-60% by repeat imaging in 08/2019. He denies any orthopnea, PND or lower extremity edema. Appears euvolemic by examination today. Remains on Lisinopril 10 mg daily and Lopressor 25 mg twice daily.  3. HTN - BP is well controlled at 108/60 during today's visit. Continue current medication regimen.  4. HLD - He remains on Pravastatin 40 mg daily as he was previously intolerant to Atorvastatin, Crestor and Zetia. He is not interested in PCSK9 inhibitor therapy at this time. Will request a copy of his most recent labs from his PCP.   Medication Adjustments/Labs and Tests  Ordered: Current medicines are reviewed at length with the patient today.  Concerns regarding medicines are outlined above.  Medication changes, Labs and Tests ordered today are  listed in the Patient Instructions below. Patient Instructions  Medication Instructions:  Your physician recommends that you continue on your current medications as directed. Please refer to the Current Medication list given to you today.  *If you need a refill on your cardiac medications before your next appointment, please call your pharmacy*   Lab Work: NONE   If you have labs (blood work) drawn today and your tests are completely normal, you will receive your results only by: [name] MyChart Message (if you have MyChart) OR . A paper copy in the mail If you have any lab test that is abnormal or we need to change your treatment, we will call you to review the results.   Testing/Procedures: NONE    Follow-Up: At [facility], you and your health needs are our priority.  As part of our continuing mission to provide you with exceptional heart care, we have created designated Provider Care Teams.  These Care Teams include your primary Cardiologist (physician) and Advanced Practice Providers (APPs -  Physician Assistants and Nurse Practitioners) who all work together to provide you with the care you need, when you need it.  We recommend signing up for the patient portal called "MyChart".  Sign up information is provided on this After Visit Summary.  MyChart is used to connect with patients for Virtual Visits (Telemedicine).  Patients are able to view lab/test results, encounter notes, upcoming appointments, etc.  Non-urgent messages can be sent to your provider as well.   To learn more about what you can do with MyChart, go to [email or web address].    Your next appointment:   1 year(s)  The format for your next appointment:   In Person  Provider:   Bernerd Pho, PA-C/ MD    Other Instructions Thank you  for choosing [facility]!    You can try taking Co-Q-10 to see if this helps with joint pain.     Signed, Rodney Heritage, PA-C  08/09/2020 5:22 PM    [facility] [address] [place], [place] [address] Phone: [phone] Fax: [phone]

## 2020-08-09 NOTE — Patient Instructions (Addendum)
Medication Instructions:  Your physician recommends that you continue on your current medications as directed. Please refer to the Current Medication list given to you today.  *If you need a refill on your cardiac medications before your next appointment, please call your pharmacy*   Lab Work: NONE   If you have labs (blood work) drawn today and your tests are completely normal, you will receive your results only by: [name] MyChart Message (if you have MyChart) OR . A paper copy in the mail If you have any lab test that is abnormal or we need to change your treatment, we will call you to review the results.   Testing/Procedures: NONE    Follow-Up: At [facility], you and your health needs are our priority.  As part of our continuing mission to provide you with exceptional heart care, we have created designated Provider Care Teams.  These Care Teams include your primary Cardiologist (physician) and Advanced Practice Providers (APPs -  Physician Assistants and Nurse Practitioners) who all work together to provide you with the care you need, when you need it.  We recommend signing up for the patient portal called "MyChart".  Sign up information is provided on this After Visit Summary.  MyChart is used to connect with patients for Virtual Visits (Telemedicine).  Patients are able to view lab/test results, encounter notes, upcoming appointments, etc.  Non-urgent messages can be sent to your provider as well.   To learn more about what you can do with MyChart, go to [email or web address].    Your next appointment:   1 year(s)  The format for your next appointment:   In Person  Provider:   Bernerd Pho, PA-C/ MD    Other Instructions Thank you for choosing [facility]!    You can try taking Co-Q-10 to see if this helps with joint pain.

## 2020-08-27 DIAGNOSIS — I2510 Atherosclerotic heart disease of native coronary artery without angina pectoris: Secondary | ICD-10-CM | POA: Diagnosis not present

## 2020-08-27 DIAGNOSIS — M25511 Pain in right shoulder: Secondary | ICD-10-CM | POA: Diagnosis not present

## 2020-09-14 ENCOUNTER — Ambulatory Visit: Payer: BC Managed Care – PPO | Admitting: Orthopedic Surgery

## 2020-09-14 ENCOUNTER — Other Ambulatory Visit: Payer: Self-pay

## 2020-09-14 ENCOUNTER — Ambulatory Visit: Payer: BC Managed Care – PPO

## 2020-09-14 ENCOUNTER — Encounter: Payer: Self-pay | Admitting: Orthopedic Surgery

## 2020-09-14 VITALS — BP 108/70 | HR 56 | Ht 73.0 in | Wt 152.4 lb

## 2020-09-14 DIAGNOSIS — M25511 Pain in right shoulder: Secondary | ICD-10-CM

## 2020-09-14 DIAGNOSIS — G8929 Other chronic pain: Secondary | ICD-10-CM | POA: Diagnosis not present

## 2020-09-14 NOTE — Progress Notes (Signed)
New Patient Visit  Assessment: Rodney Moore is a 60 y.o. male with the following: Right shoulder rotator cuff tendinitis  Plan: Rodney Moore has right shoulder pain that is consistent with irritation of his rotator cuff tendons.  His range of motion is pretty good, as is his strength.  However, the description of his pain as well as the chronicity and timing suggests the rotator cuff tendons are irritated.  I recommended an injection in clinic today, as well as continuing his home exercise program.  He is elected to proceed.  Based on his response to the steroid injection, we may have to consider obtaining an MRI in the future.   Procedure note injection - Right Shoulder joint (subacromial space)   Verbal consent was obtained to inject the Right Shoulder joint  Timeout was completed to confirm the site of injection.  The skin was prepped with alcohol and ethyl chloride was sprayed at the injection site.  A 21-gauge needle was used to inject 6 mg of Betamethasone and 1% lidocaine (3 cc) into the Right Shoulder using an Posterolateral approach.  There were no complications. A sterile bandage was applied.   Follow-up: Return if symptoms worsen or fail to improve.  Subjective:  Chief Complaint  Patient presents with  . Shoulder Pain    R/DOI -first part of October last year. Was reaching behind seat to pull blanket up toward front. Pain is severe at times.    History of Present Illness: Rodney Moore is a 60 y.o. male who has been referred to clinic today by Asencion Noble, MD for evaluation of right shoulder pain.  Approximately 6 months ago, he states he reached into the backseat of his car to grab a blanket, and upon doing so noted immediate pain in the anterior aspect of his shoulder.  Since then, he has had difficulty with certain activities.  He has difficulty with overhead activities.  His pain get worse at night.  He is not taking medications on a consistent basis at this time, but is  severely limited.  He notes worsening of his pain at nighttime.  He has pain over the anterior aspect of the shoulder, as well as the posterior lateral aspect.  He also has radiating pains distally.  He has attempted a home exercise program, but the use of exercise bands has exacerbated his pain.  He has not had an injection in his shoulder.  No previous x-rays are advanced imaging.   Review of Systems: No fevers or chills No numbness or tingling No chest pain No shortness of breath No bowel or bladder dysfunction No GI distress No headaches   Medical History:  Past Medical History:  Diagnosis Date  . Atelectasis of right lung    history of  . Coronary artery disease   . GERD (gastroesophageal reflux disease)   . History of nuclear stress test 09/2007   exercise myoview; mild perfusion defect in basal/mid inferior walls with mild defect reversibility; EKG negative for ischemia; low risk scan   . Hypercholesteremia   . Hypertension   . Ischemic cardiomyopathy    echo 03/2012 EF 40-45%  . Kidney stones    recurrent, every year since 1993  . Myocardial infarction [facility]) 2004   [facility], [facility] Cardiology  . Restless leg syndrome   . Sleep apnea    STOP BANG score =4    Past Surgical History:  Procedure Laterality Date  . CARDIAC CATHETERIZATION  2004   Cypher stent (3.0x71mm) to  prox LAD   . CATARACT EXTRACTION W/PHACO  03/18/2012   Procedure: CATARACT EXTRACTION PHACO AND INTRAOCULAR LENS PLACEMENT (IOC);  Surgeon: Tonny Branch, MD;  Location: [facility] ORS;  Service: Ophthalmology;  Laterality: Left;  CDE: 3.95  . CHEST TUBE INSERTION  1991   [facility] [place]  . CHOLECYSTECTOMY    . COLONOSCOPY  04/05/2012   Procedure: COLONOSCOPY;  Surgeon: Daneil Dolin, MD;  Location: [facility] ENDO SUITE;  Service: Endoscopy;  Laterality: N/A;  . CYSTOSCOPY    . TRANSTHORACIC ECHOCARDIOGRAM  03/30/2012   EF 32-95%: LV systolic function moderately reduced; RA mildly dilated; mild MR; mod TR    Family  History  Problem Relation Age of Onset  . Hypertension Father    Social History   Tobacco Use  . Smoking status: Former Smoker    Packs/day: 1.50    Years: 40.00    Pack years: 60.00    Types: Cigarettes    Quit date: 10/15/2006    Years since quitting: 13.9  . Smokeless tobacco: Never Used  Vaping Use  . Vaping Use: Never used  Substance Use Topics  . Alcohol use: Yes    Alcohol/week: 0.0 standard drinks    Comment: occasional  . Drug use: No    Allergies  Allergen Reactions  . Lipitor [Atorvastatin] Other (See Comments)    severe pain in hands  . Crestor [Rosuvastatin Calcium] Other (See Comments)    Myalgias  . Zetia [Ezetimibe] Other (See Comments)    Myalgias  . Penicillins Rash    Current Meds  Medication Sig  . ALPRAZolam (XANAX) 0.5 MG tablet 0.5 mg at bedtime.   [name] aspirin EC 81 MG tablet Take 81 mg by mouth daily.  . cetirizine (ZYRTEC) 10 MG tablet Take 10 mg by mouth daily.  [name] lisinopril (PRINIVIL,ZESTRIL) 10 MG tablet Take 10 mg by mouth daily.  . metoprolol (LOPRESSOR) 50 MG tablet Take 25 mg by mouth 2 (two) times daily.  . naproxen sodium (ALEVE) 220 MG tablet Take 220 mg by mouth as needed.  [name] NITROSTAT 0.4 MG SL tablet   . pravastatin (PRAVACHOL) 40 MG tablet TAKE 1 TABLET EVERY EVENING.  . valACYclovir (VALTREX) 500 MG tablet Take 500 mg by mouth daily.    Objective: BP 108/70   Pulse (!) 56   Ht 6\' 1"  (1.854 m)   Wt 152 lb 6 oz (69.1 kg)   BMI 20.10 kg/m   Physical Exam:  General: Alert and oriented, no acute distress Gait: Normal  Evaluation of the right shoulder demonstrates no deformity.  No swelling is appreciated.  Active forward flexion to 140 degrees with some discomfort.  Internal rotation to his lumbar spine.  External rotation is side to 35 degrees.  Negative belly press.  5/5 infraspinatus strength testing.  4+/5 supraspinatus testing.  Pain in the empty can position.  Positive Hawkins test.  Fingers are warm and well-perfused.   Sensation is intact distally.    IMAGING: I personally ordered and reviewed the following images   X-rays of the right shoulder were obtained in clinic today and demonstrates no acute injuries.  Mild loss of glenohumeral joint space.  Degenerative changes are noted within the [facility] joint.  There does appear to be some mild proximal humeral migration.  No osteophytes are appreciated.  Impression: Mild degenerative changes within the glenohumeral joint, with moderate degenerative changes of the [facility] joint.   New Medications:  No orders of the defined types were placed in this  encounter.     Mordecai Rasmussen, MD  09/14/2020 1:59 PM

## 2020-09-14 NOTE — Patient Instructions (Signed)

## 2020-11-21 ENCOUNTER — Other Ambulatory Visit: Payer: Self-pay | Admitting: Student

## 2021-02-11 DIAGNOSIS — L230 Allergic contact dermatitis due to metals: Secondary | ICD-10-CM | POA: Diagnosis not present

## 2021-02-20 DIAGNOSIS — Z125 Encounter for screening for malignant neoplasm of prostate: Secondary | ICD-10-CM | POA: Diagnosis not present

## 2021-02-20 DIAGNOSIS — E785 Hyperlipidemia, unspecified: Secondary | ICD-10-CM | POA: Diagnosis not present

## 2021-02-20 DIAGNOSIS — Z79899 Other long term (current) drug therapy: Secondary | ICD-10-CM | POA: Diagnosis not present

## 2021-02-20 DIAGNOSIS — I2510 Atherosclerotic heart disease of native coronary artery without angina pectoris: Secondary | ICD-10-CM | POA: Diagnosis not present

## 2021-02-27 DIAGNOSIS — I2510 Atherosclerotic heart disease of native coronary artery without angina pectoris: Secondary | ICD-10-CM | POA: Diagnosis not present

## 2021-02-27 DIAGNOSIS — L509 Urticaria, unspecified: Secondary | ICD-10-CM | POA: Diagnosis not present

## 2021-04-22 DIAGNOSIS — Z23 Encounter for immunization: Secondary | ICD-10-CM | POA: Diagnosis not present

## 2021-08-28 DIAGNOSIS — I2510 Atherosclerotic heart disease of native coronary artery without angina pectoris: Secondary | ICD-10-CM | POA: Diagnosis not present

## 2021-08-28 DIAGNOSIS — I255 Ischemic cardiomyopathy: Secondary | ICD-10-CM | POA: Diagnosis not present

## 2021-10-01 NOTE — Progress Notes (Signed)
? ?Cardiology Office Note   ? ?Date:  10/02/2021  ? ?ID:  Rodney Moore, DOB 1961/04/24, MRN [id number] ? ?PCP:  Asencion Noble, MD  ?Cardiologist: Previously followed by Dr. Bronson Ing --> Needs to switch to new MD ? ?No chief complaint on file. ? ? ?History of Present Illness:   ? ?Rodney Moore is a 61 y.o. male with past medical history of CAD (s/p DES to proximal LAD in 2004, low-risk NST in 11/2014), history of cardiomyopathy (EF previously 40-45% in 2013, normalized to 55-60% by repeat imaging in 08/2019), HTN and HLD who presents to the office today for annual follow-up.  ? ?The pt was seen by Court Joy in the past    last seen in 2021   LDL above goal but he was intolerant to Zetia, did not want to try Repatha.   Echo showed LVEF 55 to 60%   Akinesis fo the distal anterior septum;  mid/distal anterior wall and apex   were hypokinetic.    ?The pt was previously followed by Court Joy   Seen last by B Strader   Last seen in Jan 2022   ? ? ?Since seen by B Strader in 2022 the pt denies Cp  Breathing is OK   Has occasional  "gas" sensation    Not associated with activity    ?He says he walks  5 miles EVERY day      No change in ability to do   No CP when runs  ? ?Diet:    ?Breakfast   Honey nut cheerios   Bacon egg biscut   OJ ?Lunch    Bananas 3x per week   [place] sandwich   Water and diet dr pepper ?Dinner   [employer]  ? ?Snacks   Graham crackers  Occaional oreos     ? ? ? ?Past Medical History:  ?Diagnosis Date  ? Atelectasis of right lung   ? history of  ? Coronary artery disease   ? GERD (gastroesophageal reflux disease)   ? History of nuclear stress test 09/12/2007  ? exercise myoview; mild perfusion defect in basal/mid inferior walls with mild defect reversibility; EKG negative for ischemia; low risk scan   ? Hypercholesteremia   ? Hypertension   ? Ischemic cardiomyopathy   ? echo 03/2012 EF 40-45%  ? Kidney stones   ? recurrent, every year since 1993  ? Myocardial infarction  ([place]) 07/14/2002  ? [facility], [facility]iology  ? Restless leg syndrome   ? Sleep apnea   ? STOP BANG score =4  ? ? ?Past Surgical History:  ?Procedure Laterality Date  ? CARDIAC CATHETERIZATION  2004  ? Cypher stent (3.0x67m) to prox LAD   ? CATARACT EXTRACTION W/PHACO  03/18/2012  ? Procedure: CATARACT EXTRACTION PHACO AND INTRAOCULAR LENS PLACEMENT (IOC);  Surgeon: KTonny Branch MD;  Location: [facility] OR[facility];  Service: Ophthalmology;  Laterality: Left;  CDE: 3.95  ? CHEST TUBE INSERTION  1991  ? [facility] [place]  ? CHOLECYSTECTOMY    ? COLONOSCOPY  04/05/2012  ? Procedure: COLONOSCOPY;  Surgeon: RDaneil Dolin MD;  Location: [facility] ENDO SUITE;  Service: Endoscopy;  Laterality: N/A;  ? CYSTOSCOPY    ? TRANSTHORACIC ECHOCARDIOGRAM  03/30/2012  ? EF 440-34% LV systolic function moderately reduced; RA mildly dilated; mild MR; mod TR  ? ? ?Current Medications: ?Outpatient Medications Prior to Visit  ?Medication Sig Dispense Refill  ? ALPRAZolam (XANAX) 0.5 MG  tablet 0.5 mg at bedtime.     ? aspirin EC 81 MG tablet Take 81 mg by mouth daily.    ? cetirizine (ZYRTEC) 10 MG tablet Take 10 mg by mouth daily.    ? lisinopril (PRINIVIL,ZESTRIL) 10 MG tablet Take 10 mg by mouth daily.    ? metoprolol (LOPRESSOR) 50 MG tablet Take 25 mg by mouth 2 (two) times daily.    ? naproxen sodium (ALEVE) 220 MG tablet Take 220 mg by mouth as needed.    ? NITROSTAT 0.4 MG SL tablet     ? pravastatin (PRAVACHOL) 40 MG tablet TAKE 1 TABLET EVERY EVENING. 90 tablet 3  ? valACYclovir (VALTREX) 500 MG tablet Take 500 mg by mouth daily.    ? ?No facility-administered medications prior to visit.  ?  ? ?Allergies:   Lipitor [atorvastatin], Crestor [rosuvastatin calcium], Zetia [ezetimibe], and Penicillins  ? ?Social History  ? ?Socioeconomic History  ? Marital status: Married  ?  Spouse name: Not on file  ? Number of children: Not on file  ? Years of education: Not on file  ? Highest education level: Not on file  ?Occupational History  ?  Employer: NOT  EMPLOYED  ?Tobacco Use  ? Smoking status: Former  ?  Packs/day: 1.50  ?  Years: 40.00  ?  Pack years: 60.00  ?  Types: Cigarettes  ?  Quit date: 10/15/2006  ?  Years since quitting: 14.9  ? Smokeless tobacco: Never  ?Vaping Use  ? Vaping Use: Never used  ?Substance and Sexual Activity  ? Alcohol use: Yes  ?  Alcohol/week: 0.0 standard drinks  ?  Comment: occasional  ? Drug use: No  ? Sexual activity: Not on file  ?Other Topics Concern  ? Not on file  ?Social History Narrative  ? Not on file  ? ?Social Determinants of Health  ? ?Financial Resource Strain: Not on file  ?Food Insecurity: Not on file  ?Transportation Needs: Not on file  ?Physical Activity: Not on file  ?Stress: Not on file  ?Social Connections: Not on file  ?  ? ?Family History:  The patient's family history includes Hypertension in his father.  ? ?Review of Systems:   ?Please see the history of present illness.    ? ?General:  No chills, fever, night sweats or weight changes. Positive for joint pain.  ?Cardiovascular:  No chest pain, dyspnea on exertion, edema, orthopnea, palpitations, paroxysmal nocturnal dyspnea. ?Dermatological: No rash, lesions/masses ?Respiratory: No cough, dyspnea ?Urologic: No hematuria, dysuria ?Abdominal:   No nausea, vomiting, diarrhea, bright red blood per rectum, melena, or hematemesis ?Neurologic:  No visual changes, wkns, changes in mental status. ?All other systems reviewed and are otherwise negative except as noted above. ? ? ?Physical Exam:   ? ?VS:  BP 116/76   Pulse (!) 54   Ht '6\' 1"'$  (1.854 m)   Wt 156 lb 3.2 oz (70.9 kg)   SpO2 98%   BMI 20.61 kg/m?    ?General:   Thin 61 yo appearing in no acute distress. ?Head: Normocephalic, atraumatic. ?Neck: No carotid bruits. JVD not elevated.  ?Lungs: Respirations regular and unlabore ?Heart: Regular rate and rhythm. No S3 or S4.  No murmur, ?Abdomen: nontender   No Hepatomegaly    ?Msk:  Strength and tone appear normal for age. No obvious joint deformities or  effusions. ?Extremities: No clubbing or cyanosis. No edema.  Distal pedal pulses are 2+ bilaterally. ?Neuro: Alert and oriented X 3. Moves all  extremities spontaneously. No focal deficits noted. ?Psych:  Responds to questions appropriately with a normal affect. ?Skin: No rashes or lesions noted ? ?Wt Readings from Last 3 Encounters:  ?10/02/21 156 lb 3.2 oz (70.9 kg)  ?09/14/20 152 lb 6 oz (69.1 kg)  ?08/09/20 155 lb (70.3 kg)  ?  ? ? ?Studies/Labs Reviewed:  ? ?EKG:  EKG is ordered today. Sinus bradycardia  54 bpm   Septal infarct ? ?Recent Labs: ?No results found for requested labs within last 8760 hours.  ? ?Lipid Panel ?   ?Component Value Date/Time  ? CHOL 139 03/13/2017 0744  ? TRIG 73 03/13/2017 0744  ? HDL 37 (L) 03/13/2017 0744  ? [place] 87 03/13/2017 0744  ? ? ?Additional studies/ records that were reviewed today include:  ? ?NST: 11/2014 ?There was no ST segment deviation noted during stress. ?The left ventricular ejection fraction is moderately decreased (30-44%). ?This is an overall low to intermediate risk study. Decreased LVEF suggests increased risk, however exercise capacity and Duke treadmill score support low risk ?Evidence of prior inferior and anterior wall infarct, no current myocardium at jeopardy ?  ? ?Echocardiogram: 08/2019 ?IMPRESSIONS  ? ? ? 1. The mid to distal anteroseptal wall is akinetic. The mid to distal  ?anterior wall and apex are hypokinetic. [name] Left ventricular ejection  ?fraction, by estimation, is 55 to 60%. The left ventricle has normal  ?function. The left ventrical demonstrates  ?regional wall motion abnormalities (see scoring diagram/findings for  ?description). Left ventricular diastolic parameters were normal.  ? 2. Right ventricular systolic function is normal. The right ventricular  ?size is normal.  ? 3. The mitral valve is normal in structure and function. no evidence of  ?mitral valve regurgitation. No evidence of mitral stenosis.  ? 4. The aortic valve is  tricuspid. Aortic valve regurgitation is not  ?visualized. No aortic stenosis is present.  ? 5. The inferior vena cava is dilated in size with >50% respiratory  ?variability, suggesting right atrial pressure of 8 mmHg.  ? ? ? ?Assessme

## 2021-10-02 ENCOUNTER — Other Ambulatory Visit: Payer: Self-pay

## 2021-10-02 ENCOUNTER — Ambulatory Visit (INDEPENDENT_AMBULATORY_CARE_PROVIDER_SITE_OTHER): Payer: BC Managed Care – PPO | Admitting: Internal Medicine

## 2021-10-02 ENCOUNTER — Encounter: Payer: Self-pay | Admitting: Internal Medicine

## 2021-10-02 VITALS — BP 116/76 | HR 54 | Ht 73.0 in | Wt 156.2 lb

## 2021-10-02 DIAGNOSIS — I2510 Atherosclerotic heart disease of native coronary artery without angina pectoris: Secondary | ICD-10-CM

## 2021-10-02 NOTE — Patient Instructions (Signed)
Medication Instructions:  ?Your physician recommends that you continue on your current medications as directed. Please refer to the Current Medication list given to you today. ? ?*If you need a refill on your cardiac medications before your next appointment, please call your pharmacy* ? ? ?Lab Work: ?NONE  ? ?If you have labs (blood work) drawn today and your tests are completely normal, you will receive your results only by: ?MyChart Message (if you have MyChart) OR ?A paper copy in the mail ?If you have any lab test that is abnormal or we need to change your treatment, we will call you to review the results. ? ? ?Testing/Procedures: ?NONE  ? ? ?Follow-Up: ?At [facility], you and your health needs are our priority.  As part of our continuing mission to provide you with exceptional heart care, we have created designated Provider Care Teams.  These Care Teams include your primary Cardiologist (physician) and Advanced Practice Providers (APPs -  Physician Assistants and Nurse Practitioners) who all work together to provide you with the care you need, when you need it. ? ?We recommend signing up for the patient portal called "MyChart".  Sign up information is provided on this After Visit Summary.  MyChart is used to connect with patients for Virtual Visits (Telemedicine).  Patients are able to view lab/test results, encounter notes, upcoming appointments, etc.  Non-urgent messages can be sent to your provider as well.   ?To learn more about what you can do with MyChart, go to [email or web address].   ? ?Your next appointment:   ?1 year(s) ? ?The format for your next appointment:   ?In Person ? ?Provider:   ?Dorris Carnes, MD  ? ? ?Other Instructions ?Thank you for choosing [facility]! ? ? ? ?

## 2021-10-29 ENCOUNTER — Other Ambulatory Visit: Payer: Self-pay | Admitting: Student

## 2022-02-18 DIAGNOSIS — Z79899 Other long term (current) drug therapy: Secondary | ICD-10-CM | POA: Diagnosis not present

## 2022-02-18 DIAGNOSIS — Z125 Encounter for screening for malignant neoplasm of prostate: Secondary | ICD-10-CM | POA: Diagnosis not present

## 2022-02-18 DIAGNOSIS — I255 Ischemic cardiomyopathy: Secondary | ICD-10-CM | POA: Diagnosis not present

## 2022-02-18 DIAGNOSIS — F419 Anxiety disorder, unspecified: Secondary | ICD-10-CM | POA: Diagnosis not present

## 2022-02-18 DIAGNOSIS — I2510 Atherosclerotic heart disease of native coronary artery without angina pectoris: Secondary | ICD-10-CM | POA: Diagnosis not present

## 2022-02-18 DIAGNOSIS — I10 Essential (primary) hypertension: Secondary | ICD-10-CM | POA: Diagnosis not present

## 2022-02-25 DIAGNOSIS — I2510 Atherosclerotic heart disease of native coronary artery without angina pectoris: Secondary | ICD-10-CM | POA: Diagnosis not present

## 2022-02-25 DIAGNOSIS — E785 Hyperlipidemia, unspecified: Secondary | ICD-10-CM | POA: Diagnosis not present

## 2022-02-26 ENCOUNTER — Encounter: Payer: Self-pay | Admitting: *Deleted

## 2022-03-05 ENCOUNTER — Encounter: Payer: Self-pay | Admitting: *Deleted

## 2022-03-05 NOTE — Patient Instructions (Signed)
  Procedure: colonoscopy  Estimated body mass index is 20.03 kg/m as calculated from the following:   Height as of this encounter: '6\' 2"'$  (1.88 m).   Weight as of this encounter: 156 lb (70.8 kg).   Have you had a colonoscopy before?  Yes, 10 years ago, Dr. Gala Romney  Do you have family history of colon cancer  No  Do you have a family history of polyps? Yes  Previous colonoscopy with polyps removed? Yes  Do you have a history colorectal cancer?   No  Are you diabetic?  No  Do you have a prosthetic or mechanical heart valve? No  Do you have a pacemaker/defibrillator?   No  Have you had endocarditis/atrial fibrillation?  No  Do you use supplemental oxygen/CPAP?  No  Have you had joint replacement within the last 12 months?  No  Do you tend to be constipated or have to use laxatives?  No   Do you have history of alcohol use? If yes, how much and how often.  No  Do you have history or are you using drugs? If yes, what do are you  using?  No  Have you ever had a stroke/heart attack?  Heart attack, Feb 2004  Have you ever had a heart or other vascular stent placed,? Feb 2004  Do you take weight loss medication? No  Do you take any blood-thinning medications such as: (Plavix, aspirin, Coumadin, Aggrenox, Brilinta, Xarelto, Eliquis, Pradaxa, Savaysa or Effient) Yes  If yes we need the name, milligram, dosage and who is prescribing doctor:  Bayer aspirin 81 mg once a day, Dr.Fagan             Current Outpatient Medications  Medication Sig Dispense Refill   Coenzyme Q10 (CO Q 10) 100 MG CAPS Take 100 mg by mouth daily.     Turmeric (QC TUMERIC COMPLEX) 500 MG CAPS Take 500 mg by mouth daily.     ALPRAZolam (XANAX) 0.5 MG tablet 0.5 mg at bedtime.      aspirin EC 81 MG tablet Take 81 mg by mouth daily.     cetirizine (ZYRTEC) 10 MG tablet Take 10 mg by mouth daily.     lisinopril (PRINIVIL,ZESTRIL) 10 MG tablet Take 10 mg by mouth daily.     metoprolol (LOPRESSOR) 50 MG tablet  Take 25 mg by mouth 2 (two) times daily.     NITROSTAT 0.4 MG SL tablet      pravastatin (PRAVACHOL) 40 MG tablet TAKE 1 TABLET EVERY EVENING. 90 tablet 3   valACYclovir (VALTREX) 500 MG tablet Take 500 mg by mouth daily.     No current facility-administered medications for this visit.    Allergies  Allergen Reactions   Lipitor [Atorvastatin] Other (See Comments)    severe pain in hands   Crestor [Rosuvastatin Calcium] Other (See Comments)    Myalgias   Zetia [Ezetimibe] Other (See Comments)    Myalgias   Penicillins Rash

## 2022-03-11 ENCOUNTER — Telehealth: Payer: Self-pay | Admitting: Internal Medicine

## 2022-03-20 ENCOUNTER — Encounter: Payer: Self-pay | Admitting: *Deleted

## 2022-03-20 NOTE — Progress Notes (Signed)
OV schd 04/03/22 at 11 w/ CM\ Apt letter mailed to patient

## 2022-04-02 NOTE — Progress Notes (Unsigned)
GI Office Note    Referring Provider: Asencion Noble, MD Primary Care Physician:  Asencion Noble, MD  Primary Gastroenterologist: Dr. Gala Romney  Chief Complaint   No chief complaint on file.   History of Present Illness   Rodney Moore is a 61 y.o. male presenting today at the request of Asencion Noble, MD for ***surveillance colonoscopy. Last colonoscopy September 2013 with left-sided diverticula and hyperplastic polyp at the rectosigmoid junction.   Recently had follow up with cardiology 10/02/21. Last Echo in 2021 with LVEF 55-60%, akinesis to distal anterior septum, mid/distal anterior wall and apex were hypokinetic. Previously intolerant to Zetia for elevated LDL. No medication changes made and advised diet changes.    Today:    Current Outpatient Medications  Medication Sig Dispense Refill   ALPRAZolam (XANAX) 0.5 MG tablet 0.5 mg at bedtime.      aspirin EC 81 MG tablet Take 81 mg by mouth daily.     cetirizine (ZYRTEC) 10 MG tablet Take 10 mg by mouth daily.     Coenzyme Q10 (CO Q 10) 100 MG CAPS Take 100 mg by mouth daily.     lisinopril (PRINIVIL,ZESTRIL) 10 MG tablet Take 10 mg by mouth daily.     metoprolol (LOPRESSOR) 50 MG tablet Take 25 mg by mouth 2 (two) times daily.     NITROSTAT 0.4 MG SL tablet      pravastatin (PRAVACHOL) 40 MG tablet TAKE 1 TABLET EVERY EVENING. 90 tablet 3   Turmeric (QC TUMERIC COMPLEX) 500 MG CAPS Take 500 mg by mouth daily.     valACYclovir (VALTREX) 500 MG tablet Take 500 mg by mouth daily.     No current facility-administered medications for this visit.    Past Medical History:  Diagnosis Date   Atelectasis of right lung    history of   Coronary artery disease    GERD (gastroesophageal reflux disease)    History of nuclear stress test 09/12/2007   exercise myoview; mild perfusion defect in basal/mid inferior walls with mild defect reversibility; EKG negative for ischemia; low risk scan    Hypercholesteremia    Hypertension    Ischemic  cardiomyopathy    echo 03/2012 EF 40-45%   Kidney stones    recurrent, every year since 1993   Myocardial infarction [facility]) 07/14/2002   [facility] [place]   Restless leg syndrome    Sleep apnea    STOP BANG score =4    Past Surgical History:  Procedure Laterality Date   CARDIAC CATHETERIZATION  2004   Cypher stent (3.0x55m) to prox LAD    CATARACT EXTRACTION W/PHACO  03/18/2012   Procedure: CATARACT EXTRACTION PHACO AND INTRAOCULAR LENS PLACEMENT ([place];  Surgeon: KTonny Branch MD;  Location: [facility];  Service: Ophthalmology;  Laterality: Left;  CDE: 3.95   CHEST TUBE INSERTION  1991   [facility], [place]   CHOLECYSTECTOMY     COLONOSCOPY  04/05/2012   Procedure: COLONOSCOPY;  Surgeon: RDaneil Dolin MD;  Location: [facility];  Service: Endoscopy;  Laterality: N/A;   CYSTOSCOPY     TRANSTHORACIC ECHOCARDIOGRAM  03/30/2012   EF 486-76% LV systolic function moderately reduced; RA mildly dilated; mild MR; mod TR    Family History  Problem Relation Age of Onset   Hypertension Father     Allergies as of 04/03/2022 - Review Complete 10/02/2021  Allergen Reaction Noted   Lipitor [atorvastatin] Other (See Comments) 07/08/2016   Crestor [rosuvastatin calcium] Other (See Comments) 08/09/2020  Zetia [ezetimibe] Other (See Comments) 08/09/2020   Penicillins Rash 03/03/2012    Social History   Socioeconomic History   Marital status: Married    Spouse name: Not on file   Number of children: Not on file   Years of education: Not on file   Highest education level: Not on file  Occupational History    Employer: NOT EMPLOYED  Tobacco Use   Smoking status: Former    Packs/day: 1.50    Years: 40.00    Total pack years: 60.00    Types: Cigarettes    Quit date: 10/15/2006    Years since quitting: 15.4   Smokeless tobacco: Never  Vaping Use   Vaping Use: Never used  Substance and Sexual Activity   Alcohol use: Yes    Alcohol/week: 0.0 standard drinks of alcohol    Comment:  occasional   Drug use: No   Sexual activity: Not on file  Other Topics Concern   Not on file  Social History Narrative   Not on file   Social Determinants of Health   Financial Resource Strain: Not on file  Food Insecurity: Not on file  Transportation Needs: Not on file  Physical Activity: Not on file  Stress: Not on file  Social Connections: Not on file  Intimate Partner Violence: Not on file     Review of Systems   Gen: Denies any fever, chills, fatigue, weight loss, lack of appetite.  CV: Denies chest pain, heart palpitations, peripheral edema, syncope.  Resp: Denies shortness of breath at rest or with exertion. Denies wheezing or cough.  GI: see HPI GU : Denies urinary burning, urinary frequency, urinary hesitancy MS: Denies joint pain, muscle weakness, cramps, or limitation of movement.  Derm: Denies rash, itching, dry skin Psych: Denies depression, anxiety, memory loss, and confusion Heme: Denies bruising, bleeding, and enlarged lymph nodes.   Physical Exam   There were no vitals taken for this visit.  General:   Alert and oriented. Pleasant and cooperative. Well-nourished and well-developed.  Head:  Normocephalic and atraumatic. Eyes:  Without icterus, sclera clear and conjunctiva pink.  Ears:  Normal auditory acuity. Mouth:  No deformity or lesions, oral mucosa pink.  Lungs:  Clear to auscultation bilaterally. No wheezes, rales, or rhonchi. No distress.  Heart:  S1, S2 present without murmurs appreciated.  Abdomen:  +BS, soft, non-tender and non-distended. No HSM noted. No guarding or rebound. No masses appreciated.  Rectal:  Deferred  Msk:  Symmetrical without gross deformities. Normal posture. Extremities:  Without edema. Neurologic:  Alert and  oriented x4;  grossly normal neurologically. Skin:  Intact without significant lesions or rashes. Psych:  Alert and cooperative. Normal mood and affect.   Assessment   Rodney Moore is a 61 y.o. male with a  history of CAD and MI in 2004 s/p stent to LAD, OSA, restless leg, ischemic cardiomyopathy, HTN, HLD, and GERD*** presenting today for evaluation prior to scheduling surveillance colonoscopy.  History of colon polyps/screening for colon cancer:  Last colonoscopy in 2013 with hyperplastic polyp at rectosigmoid junction.    PLAN   *** Proceed with colonoscopy with propofol by Dr. Gala Romney in near future: the risks, benefits, and alternatives have been discussed with the patient in detail. The patient states understanding and desires to proceed. ASA 3    Venetia Night, MSN, FNP-BC, AGACNP-BC [facility]

## 2022-04-03 ENCOUNTER — Encounter: Payer: Self-pay | Admitting: Gastroenterology

## 2022-04-03 ENCOUNTER — Ambulatory Visit (INDEPENDENT_AMBULATORY_CARE_PROVIDER_SITE_OTHER): Payer: BC Managed Care – PPO | Admitting: Gastroenterology

## 2022-04-03 VITALS — BP 130/81 | HR 66 | Temp 97.1°F | Ht 73.0 in | Wt 156.6 lb

## 2022-04-03 DIAGNOSIS — Z86010 Personal history of colonic polyps: Secondary | ICD-10-CM

## 2022-04-03 DIAGNOSIS — Z1211 Encounter for screening for malignant neoplasm of colon: Secondary | ICD-10-CM

## 2022-04-03 NOTE — Patient Instructions (Addendum)
We are scheduling you for colonoscopy in the near future with Dr. Gala Romney.  You will receive separate written instructions in the mail for your prep.  The prep will be mailed to your pharmacy on file.  You have any issues with your prep, please call the office.  If you decide you do not like the prep that is paid for by your insurance, please contact the office and we can give you instructions for a MiraLAX prep.  It was a pleasure to see you today. I want to create trusting relationships with patients. If you receive a survey regarding your visit,  I greatly appreciate you taking time to fill this out on paper or through your MyChart. I value your feedback.  Venetia Night, MSN, FNP-BC, AGACNP-BC [facility]

## 2022-04-16 ENCOUNTER — Telehealth: Payer: Self-pay | Admitting: *Deleted

## 2022-04-16 ENCOUNTER — Encounter: Payer: Self-pay | Admitting: *Deleted

## 2022-04-16 MED ORDER — PEG 3350-KCL-NA BICARB-NACL 420 G PO SOLR: 4000.0000 mL | Freq: Once | ORAL | 0 refills | Status: AC

## 2022-04-16 NOTE — Telephone Encounter (Signed)
LMOVM to call back to schedule tcs w/ propofol asa 3, Dr. Gala Romney

## 2022-04-16 NOTE — Telephone Encounter (Signed)
Pt has been scheduled 06/02/22 at 9:45 am. Instructions will be mailed, along with pre-op date. Prep sent to the pharmacy

## 2022-04-21 DIAGNOSIS — Z23 Encounter for immunization: Secondary | ICD-10-CM | POA: Diagnosis not present

## 2022-05-28 ENCOUNTER — Encounter (HOSPITAL_COMMUNITY)
Admission: RE | Admit: 2022-05-28 | Discharge: 2022-05-28 | Disposition: A | Discharge status: Home / Self Care | Payer: BC Managed Care – PPO | Source: Ambulatory Visit | Attending: Internal Medicine | Admitting: Internal Medicine

## 2022-05-28 NOTE — Pre-Procedure Instructions (Signed)
Attempted pre-op phone call. Left VM for patient to call back. 

## 2022-06-02 ENCOUNTER — Other Ambulatory Visit: Payer: Self-pay

## 2022-06-02 ENCOUNTER — Ambulatory Visit (HOSPITAL_COMMUNITY)
Admission: RE | Admit: 2022-06-02 | Discharge: 2022-06-02 | Disposition: A | Discharge status: Home / Self Care | Payer: BC Managed Care – PPO | Source: Ambulatory Visit | Attending: Internal Medicine | Admitting: Internal Medicine

## 2022-06-02 ENCOUNTER — Ambulatory Visit (HOSPITAL_COMMUNITY): Payer: BC Managed Care – PPO | Admitting: Anesthesiology

## 2022-06-02 ENCOUNTER — Encounter (HOSPITAL_COMMUNITY)
Admission: RE | Disposition: A | Discharge status: Home / Self Care | Payer: Self-pay | Source: Ambulatory Visit | Attending: Internal Medicine

## 2022-06-02 ENCOUNTER — Encounter (HOSPITAL_COMMUNITY): Payer: Self-pay | Admitting: Internal Medicine

## 2022-06-02 DIAGNOSIS — I2510 Atherosclerotic heart disease of native coronary artery without angina pectoris: Secondary | ICD-10-CM | POA: Diagnosis not present

## 2022-06-02 DIAGNOSIS — Z87891 Personal history of nicotine dependence: Secondary | ICD-10-CM | POA: Insufficient documentation

## 2022-06-02 DIAGNOSIS — G4730 Sleep apnea, unspecified: Secondary | ICD-10-CM | POA: Diagnosis not present

## 2022-06-02 DIAGNOSIS — K5730 Diverticulosis of large intestine without perforation or abscess without bleeding: Secondary | ICD-10-CM | POA: Diagnosis not present

## 2022-06-02 DIAGNOSIS — K219 Gastro-esophageal reflux disease without esophagitis: Secondary | ICD-10-CM | POA: Insufficient documentation

## 2022-06-02 DIAGNOSIS — Z1212 Encounter for screening for malignant neoplasm of rectum: Secondary | ICD-10-CM

## 2022-06-02 DIAGNOSIS — I10 Essential (primary) hypertension: Secondary | ICD-10-CM | POA: Diagnosis not present

## 2022-06-02 DIAGNOSIS — Z1211 Encounter for screening for malignant neoplasm of colon: Secondary | ICD-10-CM | POA: Diagnosis not present

## 2022-06-02 DIAGNOSIS — I252 Old myocardial infarction: Secondary | ICD-10-CM | POA: Insufficient documentation

## 2022-06-02 HISTORY — PX: COLONOSCOPY WITH PROPOFOL: SHX5780

## 2022-06-02 SURGERY — COLONOSCOPY WITH PROPOFOL
Anesthesia: General

## 2022-06-02 MED ORDER — PROPOFOL 500 MG/50ML IV EMUL
INTRAVENOUS | Status: DC | PRN
  Administered 2022-06-02: 150 ug/kg/min via INTRAVENOUS

## 2022-06-02 MED ORDER — DEXMEDETOMIDINE HCL IN NACL 80 MCG/20ML IV SOLN
INTRAVENOUS | Status: DC | PRN
  Administered 2022-06-02 (×2): 12 ug via BUCCAL

## 2022-06-02 MED ORDER — PHENYLEPHRINE HCL (PRESSORS) 10 MG/ML IV SOLN
INTRAVENOUS | Status: DC | PRN
  Administered 2022-06-02 (×3): 160 ug via INTRAVENOUS

## 2022-06-02 MED ORDER — LACTATED RINGERS IV SOLN: INTRAVENOUS | Status: DC

## 2022-06-02 MED ORDER — PROPOFOL 10 MG/ML IV BOLUS
INTRAVENOUS | Status: DC | PRN
  Administered 2022-06-02 (×2): 50 mg via INTRAVENOUS

## 2022-06-02 MED ORDER — LIDOCAINE HCL (CARDIAC) PF 100 MG/5ML IV SOSY
PREFILLED_SYRINGE | INTRAVENOUS | Status: DC | PRN
  Administered 2022-06-02: 100 mg via INTRAVENOUS

## 2022-06-02 MED ORDER — STERILE WATER FOR IRRIGATION IR SOLN
Status: DC | PRN
  Administered 2022-06-02: 180 mL

## 2022-06-02 NOTE — Op Note (Signed)
[facility] Patient Name: Rodney Moore Procedure Date: 06/02/2022 10:05 AM MRN: [id number] Date of Birth: April 25, 1961 Attending MD: Norvel Richards , MD, [id number] CSN: [id number] Age: 61 Admit Type: Outpatient Procedure:                Colonoscopy Indications:              Screening for colorectal malignant neoplasm Providers:                Norvel Richards, MD, Caprice Kluver, [place] Page,                            [place] Gene Technician, Technician Referring MD:              Medicines:                Propofol per Anesthesia Complications:            No immediate complications. Estimated Blood Loss:     Estimated blood loss: none. Procedure:                Pre-Anesthesia Assessment:                           - Prior to the procedure, a History and Physical                            was performed, and patient medications and                            allergies were reviewed. The patient's tolerance of                            previous anesthesia was also reviewed. The risks                            and benefits of the procedure and the sedation                            options and risks were discussed with the patient.                            All questions were answered, and informed consent                            was obtained. Prior Anticoagulants: The patient has                            taken no anticoagulant or antiplatelet agents. ASA                            Grade Assessment: III - A patient with severe                            systemic disease. After reviewing the risks and  benefits, the patient was deemed in satisfactory                            condition to undergo the procedure.                           After obtaining informed consent, the colonoscope                            was passed under direct vision. Throughout the                            procedure, the patient's blood pressure, pulse, and                             oxygen saturations were monitored continuously. The                            [id number] scope was introduced through the                            anus and advanced to the the cecum, identified by                            appendiceal orifice and ileocecal valve. The                            colonoscopy was performed without difficulty. The                            patient tolerated the procedure well. The quality                            of the bowel preparation was adequate. The                            ileocecal valve, appendiceal orifice, and rectum                            were photographed. The entire colon was well                            visualized. Scope In: 10:28:42 AM Scope Out: 10:41:44 AM Scope Withdrawal Time: 0 hours 6 minutes 44 seconds  Total Procedure Duration: 0 hours 13 minutes 2 seconds  Findings:      The perianal and digital rectal examinations were normal.      Scattered medium-mouthed diverticula were found in the sigmoid colon and       descending colon.      The exam was otherwise without abnormality on direct and retroflexion       views. Impression:               - Diverticulosis in the sigmoid colon and in the  descending colon.                           - The examination was otherwise normal on direct                            and retroflexion views.                           - No specimens collected. Moderate Sedation:      Moderate (conscious) sedation was personally administered by an       anesthesia professional. The following parameters were monitored: oxygen       saturation, heart rate, blood pressure, respiratory rate, EKG, adequacy       of pulmonary ventilation, and response to care. Recommendation:           - Patient has a contact number available for                            emergencies. The signs and symptoms of potential                            delayed  complications were discussed with the                            patient. Return to normal activities tomorrow.                            Written discharge instructions were provided to the                            patient.                           - Advance diet as tolerated.                           - Continue present medications.                           - Repeat colonoscopy in 10 years for screening                            purposes.                           - Return to GI office (date not yet determined). Procedure Code(s):        --- Professional ---                           [phone], Colonoscopy, flexible; diagnostic, including                            collection of specimen(s) by brushing or washing,                            when performed (separate  procedure) Diagnosis Code(s):        --- Professional ---                           Z12.11, Encounter for screening for malignant                            neoplasm of colon                           K57.30, Diverticulosis of large intestine without                            perforation or abscess without bleeding CPT copyright 2022 American Medical Association. All rights reserved. The codes documented in this report are preliminary and upon coder review may  be revised to meet current compliance requirements. Cristopher Estimable. Donita Newland, MD Norvel Richards, MD 06/02/2022 10:49:42 AM This report has been signed electronically. Number of Addenda: 0

## 2022-06-02 NOTE — H&P (Signed)
$'@LOGO'X$ @   Primary Care Physician:  Asencion Noble, MD Primary Gastroenterologist:  Dr. Gala Romney  Pre-Procedure History & Physical: HPI:  Rodney Moore is a 61 y.o. male is here for a screening colonoscopy.  negative colonoscopy 10 years ago (hyperplastic polyp diverticulosis).  No bowel symptoms at this time.  Past Medical History:  Diagnosis Date   Atelectasis of right lung    history of   Coronary artery disease    GERD (gastroesophageal reflux disease)    History of nuclear stress test 09/12/2007   exercise myoview; mild perfusion defect in basal/mid inferior walls with mild defect reversibility; EKG negative for ischemia; low risk scan    Hypercholesteremia    Hypertension    Ischemic cardiomyopathy    echo 03/2012 EF 40-45%   Kidney stones    recurrent, every year since 1993   Myocardial infarction [facility]) 07/14/2002   [facility]   Restless leg syndrome    Sleep apnea    STOP BANG score =4    Past Surgical History:  Procedure Laterality Date   CARDIAC CATHETERIZATION  2004   Cypher stent (3.0x1m) to prox LAD    CATARACT EXTRACTION W/PHACO  03/18/2012   Procedure: CATARACT EXTRACTION PHACO AND INTRAOCULAR LENS PLACEMENT ([place];  Surgeon: KTonny Branch MD;  Location: [facility];  Service: Ophthalmology;  Laterality: Left;  CDE: 3.95   CHEST TUBE INSERTION  1991   [facility], [facility], [place]   CHOLECYSTECTOMY     COLONOSCOPY  04/05/2012   Procedure: COLONOSCOPY;  Surgeon: RDaneil Dolin MD;  Location[facility];  Service: Endoscopy;  Laterality: N/A;   CYSTOSCOPY     TRANSTHORACIC ECHOCARDIOGRAM  03/30/2012   EF 477-41% LV systolic function moderately reduced; RA mildly dilated; mild MR; mod TR    Prior to Admission medications   Medication Sig Start Date End Date Taking? Authorizing Provider  ALPRAZolam (Duanne Moron 0.5 MG tablet 0.5 mg at bedtime.  11/08/14  Yes [provider]  aspirin EC 81 MG tablet Take 81 mg by mouth daily.   Yes [provider]   cetirizine (ZYRTEC) 10 MG tablet Take 10 mg by mouth daily.   Yes [provider]  Coenzyme Q10 (CO Q 10) 100 MG CAPS Take 100 mg by mouth daily.   Yes [provider]  lisinopril (PRINIVIL,ZESTRIL) 10 MG tablet Take 10 mg by mouth daily.   Yes [provider]  metoprolol tartrate (LOPRESSOR) 25 MG tablet Take 25 mg by mouth 2 (two) times daily. 03/19/22  Yes [provider]  pravastatin (PRAVACHOL) 40 MG tablet TAKE 1 TABLET EVERY EVENING. 10/30/21  Yes Strader, [place] PA-C  triamcinolone (KENALOG) 0.1 % paste SMARTSIG:TO TEETH 01/22/22  Yes [provider]  Turmeric (QC TUMERIC COMPLEX) 500 MG CAPS Take 500 mg by mouth daily.   Yes [provider]  valACYclovir (VALTREX) 500 MG tablet Take 500 mg by mouth daily.   Yes [provider]  NITROSTAT 0.4 MG SL tablet  08/16/14   [provider]    Allergies as of 04/16/2022 - Review Complete 04/03/2022  Allergen Reaction Noted   Lipitor [atorvastatin] Other (See Comments) 07/08/2016   Crestor [rosuvastatin calcium] Other (See Comments) 08/09/2020   Zetia [ezetimibe] Other (See Comments) 08/09/2020   Penicillins Rash 03/03/2012    Family History  Problem Relation Age of Onset   Hypertension Father     Social History   Socioeconomic History   Marital status: Married    Spouse name:  Not on file   Number of children: Not on file   Years of education: Not on file   Highest education level: Not on file  Occupational History    Employer: NOT EMPLOYED  Tobacco Use   Smoking status: Former    Packs/day: 1.50    Years: 40.00    Total pack years: 60.00    Types: Cigarettes    Quit date: 10/15/2006    Years since quitting: 15.6   Smokeless tobacco: Never  Vaping Use   Vaping Use: Never used  Substance and Sexual Activity   Alcohol use: Yes    Alcohol/week: 0.0 standard drinks of alcohol    Comment: occasional   Drug use: No   Sexual activity: Yes  Other Topics  Concern   Not on file  Social History Narrative   Not on file   Social Determinants of Health   Financial Resource Strain: Not on file  Food Insecurity: Not on file  Transportation Needs: Not on file  Physical Activity: Not on file  Stress: Not on file  Social Connections: Not on file  Intimate Partner Violence: Not on file    Review of Systems: See HPI, otherwise negative ROS  Physical Exam: BP 134/82   Pulse 91   Ht '6\' 1"'$  (1.854 m)   Wt 70.3 kg   SpO2 99%   BMI 20.45 kg/m  General:   Alert,  Well-developed, well-nourished, pleasant and cooperative in NAD Lungs:  Clear throughout to auscultation.   No wheezes, crackles, or rhonchi. No acute distress. Heart:  Regular rate and rhythm; no murmurs, clicks, rubs,  or gallops. Abdomen:  Soft, nontender and nondistended. No masses, hepatosplenomegaly or hernias noted. Normal bowel sounds, without guarding, and without rebound.    Impression/Plan: Rodney Moore is now here to undergo a screening colonoscopy.    Average risk screening examination.  Risks, benefits, limitations, imponderables and alternatives regarding colonoscopy have been reviewed with the patient. Questions have been answered. All parties agreeable.     Notice:  This dictation was prepared with Dragon dictation along with smaller phrase technology. Any transcriptional errors that result from this process are unintentional and may not be corrected upon review.

## 2022-06-02 NOTE — Discharge Instructions (Addendum)
  Colonoscopy Discharge Instructions  Read the instructions outlined below and refer to this sheet in the next few weeks. These discharge instructions provide you with general information on caring for yourself after you leave the hospital. Your doctor may also give you specific instructions. While your treatment has been planned according to the most current medical practices available, unavoidable complications occasionally occur. If you have any problems or questions after discharge, call Dr. Gala Romney at [phone]. ACTIVITY You may resume your regular activity, but move at a slower pace for the next 24 hours.  Take frequent rest periods for the next 24 hours.  Walking will help get rid of the air and reduce the bloated feeling in your belly (abdomen).  No driving for 24 hours (because of the medicine (anesthesia) used during the test).   Do not sign any important legal documents or operate any machinery for 24 hours (because of the anesthesia used during the test).  NUTRITION Drink plenty of fluids.  You may resume your normal diet as instructed by your doctor.  Begin with a light meal and progress to your normal diet. Heavy or fried foods are harder to digest and may make you feel sick to your stomach (nauseated).  Avoid alcoholic beverages for 24 hours or as instructed.  MEDICATIONS You may resume your normal medications unless your doctor tells you otherwise.  WHAT YOU CAN EXPECT TODAY Some feelings of bloating in the abdomen.  Passage of more gas than usual.  Spotting of blood in your stool or on the toilet paper.  IF YOU HAD POLYPS REMOVED DURING THE COLONOSCOPY: No aspirin products for 7 days or as instructed.  No alcohol for 7 days or as instructed.  Eat a soft diet for the next 24 hours.  FINDING OUT THE RESULTS OF YOUR TEST Not all test results are available during your visit. If your test results are not back during the visit, make an appointment with your caregiver to find out the  results. Do not assume everything is normal if you have not heard from your caregiver or the medical facility. It is important for you to follow up on all of your test results.  SEEK IMMEDIATE MEDICAL ATTENTION IF: You have more than a spotting of blood in your stool.  Your belly is swollen (abdominal distention).  You are nauseated or vomiting.  You have a temperature over 101.  You have abdominal pain or discomfort that is severe or gets worse throughout the day.    diverticulosis only;  diverticulosis information provided  No polyps  Recommend repeat colonoscopy for screening purposes in 10 years  at patient request, I called Donia Guiles at [phone] -  reviewed recommendations

## 2022-06-02 NOTE — Anesthesia Preprocedure Evaluation (Signed)
Anesthesia Evaluation  Patient identified by MRN, date of birth, ID band Patient awake    Reviewed: Allergy & Precautions, H&P , NPO status , Patient's Chart, lab work & pertinent test results, reviewed documented beta blocker date and time   Airway Mallampati: II  TM Distance: >3 FB Neck ROM: full    Dental no notable dental hx.    Pulmonary sleep apnea , former smoker   Pulmonary exam normal breath sounds clear to auscultation       Cardiovascular Exercise Tolerance: Good hypertension, + CAD and + Past MI   Rhythm:regular Rate:Normal     Neuro/Psych negative neurological ROS  negative psych ROS   GI/Hepatic Neg liver ROS,GERD  Medicated,,  Endo/Other  negative endocrine ROS    Renal/GU negative Renal ROS  negative genitourinary   Musculoskeletal   Abdominal   Peds  Hematology negative hematology ROS (+)   Anesthesia Other Findings   Reproductive/Obstetrics negative OB ROS                             Anesthesia Physical Anesthesia Plan  ASA: 3  Anesthesia Plan: General   Post-op Pain Management:    Induction:   PONV Risk Score and Plan: Propofol infusion  Airway Management Planned:   Additional Equipment:   Intra-op Plan:   Post-operative Plan:   Informed Consent: I have reviewed the patients History and Physical, chart, labs and discussed the procedure including the risks, benefits and alternatives for the proposed anesthesia with the patient or authorized representative who has indicated his/her understanding and acceptance.     Dental Advisory Given  Plan Discussed with: CRNA  Anesthesia Plan Comments:        Anesthesia Quick Evaluation

## 2022-06-02 NOTE — Transfer of Care (Signed)
Immediate Anesthesia Transfer of Care Note  Patient: Rodney Moore  Procedure(s) Performed: COLONOSCOPY WITH PROPOFOL  Patient Location: Short Stay  Anesthesia Type:General  Level of Consciousness: awake, alert , oriented, and patient cooperative  Airway & Oxygen Therapy: Patient Spontanous Breathing  Post-op Assessment: Report given to RN, Post -op Vital signs reviewed and stable, and Patient moving all extremities X 4  Post vital signs: Reviewed and stable  Last Vitals:  Vitals Value Taken Time  BP 86/55 06/02/22 1046  Temp 36.4 C 06/02/22 1046  Pulse 76 06/02/22 1046  Resp 14 06/02/22 1046  SpO2 92 % 06/02/22 1046    Last Pain:  Vitals:   06/02/22 1046  TempSrc: Axillary  PainSc: 0-No pain         Complications: No notable events documented.

## 2022-06-03 NOTE — Anesthesia Postprocedure Evaluation (Signed)
Anesthesia Post Note  Patient: Rodney Moore  Procedure(s) Performed: COLONOSCOPY WITH PROPOFOL  Patient location during evaluation: Phase II Anesthesia Type: General Level of consciousness: awake Pain management: pain level controlled Vital Signs Assessment: post-procedure vital signs reviewed and stable Respiratory status: spontaneous breathing and respiratory function stable Cardiovascular status: blood pressure returned to baseline and stable Postop Assessment: no headache and no apparent nausea or vomiting Anesthetic complications: no Comments: Late entry   No notable events documented.   Last Vitals:  Vitals:   06/02/22 1055 06/02/22 1058  BP: 92/64 114/73  Pulse: 80 78  Resp: 20 20  Temp:    SpO2: 94% 95%    Last Pain:  Vitals:   06/02/22 1046  TempSrc: Axillary  PainSc: 0-No pain                 Louann Sjogren

## 2022-06-10 ENCOUNTER — Encounter (HOSPITAL_COMMUNITY): Payer: Self-pay | Admitting: Internal Medicine

## 2022-09-01 DIAGNOSIS — N400 Enlarged prostate without lower urinary tract symptoms: Secondary | ICD-10-CM | POA: Diagnosis not present

## 2022-09-01 DIAGNOSIS — I10 Essential (primary) hypertension: Secondary | ICD-10-CM | POA: Diagnosis not present

## 2022-09-01 DIAGNOSIS — I2510 Atherosclerotic heart disease of native coronary artery without angina pectoris: Secondary | ICD-10-CM | POA: Diagnosis not present

## 2022-09-02 ENCOUNTER — Encounter (HOSPITAL_COMMUNITY): Payer: Self-pay

## 2022-09-02 ENCOUNTER — Other Ambulatory Visit: Payer: Self-pay

## 2022-09-02 ENCOUNTER — Emergency Department (HOSPITAL_COMMUNITY): Payer: BC Managed Care – PPO

## 2022-09-02 ENCOUNTER — Emergency Department (HOSPITAL_COMMUNITY)
Admission: EM | Admit: 2022-09-02 | Discharge: 2022-09-03 | Disposition: A | Discharge status: Home / Self Care | Payer: BC Managed Care – PPO | Attending: Emergency Medicine | Admitting: Emergency Medicine

## 2022-09-02 DIAGNOSIS — R0602 Shortness of breath: Secondary | ICD-10-CM | POA: Insufficient documentation

## 2022-09-02 DIAGNOSIS — Z7982 Long term (current) use of aspirin: Secondary | ICD-10-CM | POA: Diagnosis not present

## 2022-09-02 DIAGNOSIS — I2510 Atherosclerotic heart disease of native coronary artery without angina pectoris: Secondary | ICD-10-CM | POA: Diagnosis not present

## 2022-09-02 DIAGNOSIS — I700 Atherosclerosis of aorta: Secondary | ICD-10-CM | POA: Diagnosis not present

## 2022-09-02 DIAGNOSIS — Z87891 Personal history of nicotine dependence: Secondary | ICD-10-CM | POA: Insufficient documentation

## 2022-09-02 DIAGNOSIS — I10 Essential (primary) hypertension: Secondary | ICD-10-CM | POA: Insufficient documentation

## 2022-09-02 DIAGNOSIS — N200 Calculus of kidney: Secondary | ICD-10-CM | POA: Diagnosis not present

## 2022-09-02 DIAGNOSIS — J439 Emphysema, unspecified: Secondary | ICD-10-CM | POA: Diagnosis not present

## 2022-09-02 DIAGNOSIS — Z79899 Other long term (current) drug therapy: Secondary | ICD-10-CM | POA: Diagnosis not present

## 2022-09-02 DIAGNOSIS — M546 Pain in thoracic spine: Secondary | ICD-10-CM

## 2022-09-02 DIAGNOSIS — K5730 Diverticulosis of large intestine without perforation or abscess without bleeding: Secondary | ICD-10-CM | POA: Diagnosis not present

## 2022-09-02 DIAGNOSIS — N62 Hypertrophy of breast: Secondary | ICD-10-CM | POA: Diagnosis not present

## 2022-09-02 LAB — CBC
HCT: 40 % (ref 39.0–52.0)
Hemoglobin: 14 g/dL (ref 13.0–17.0)
MCH: 32.3 pg (ref 26.0–34.0)
MCHC: 35 g/dL (ref 30.0–36.0)
MCV: 92.4 fL (ref 80.0–100.0)
Platelets: 182 10*3/uL (ref 150–400)
RBC: 4.33 MIL/uL (ref 4.22–5.81)
RDW: 12.5 % (ref 11.5–15.5)
WBC: 6.7 10*3/uL (ref 4.0–10.5)
nRBC: 0 % (ref 0.0–0.2)

## 2022-09-02 LAB — TROPONIN I (HIGH SENSITIVITY)
Troponin I (High Sensitivity): 2 ng/L (ref <18)
Troponin I (High Sensitivity): 4 ng/L (ref <18)

## 2022-09-02 LAB — BASIC METABOLIC PANEL
Anion gap: 8 (ref 5–15)
BUN: 13 mg/dL (ref 8–23)
CO2: 24 mmol/L (ref 22–32)
Calcium: 9.2 mg/dL (ref 8.9–10.3)
Chloride: 105 mmol/L (ref 98–111)
Creatinine, Ser: 0.74 mg/dL (ref 0.61–1.24)
GFR, Estimated: >60 mL/min (ref >60)
Glucose, Bld: 110 mg/dL — ABNORMAL HIGH (ref 70–99)
Potassium: 4.2 mmol/L (ref 3.5–5.1)
Sodium: 137 mmol/L (ref 135–145)

## 2022-09-02 MED ORDER — IOHEXOL 350 MG/ML SOLN
100.0000 mL | Freq: Once | INTRAVENOUS | Status: AC | PRN
  Administered 2022-09-02: 100 mL via INTRAVENOUS

## 2022-09-02 NOTE — ED Notes (Signed)
Patient transported to CT 

## 2022-09-02 NOTE — ED Provider Notes (Signed)
[facility] Provider Note   CSN: [id number] Arrival date & time: 09/02/22  1631     History {Add pertinent medical, surgical, social history, OB history to HPI:1} Chief Complaint  Patient presents with   Shortness of Breath    Rodney Moore is a 62 y.o. male.   Shortness of Breath      Home Medications Prior to Admission medications   Medication Sig Start Date End Date Taking? Authorizing Provider  ALPRAZolam Duanne Moron) 0.5 MG tablet 0.5 mg at bedtime.  11/08/14   [provider]  aspirin EC 81 MG tablet Take 81 mg by mouth daily.    [provider]  cetirizine (ZYRTEC) 10 MG tablet Take 10 mg by mouth daily.    [provider]  Coenzyme Q10 (CO Q 10) 100 MG CAPS Take 100 mg by mouth daily.    [provider]  lisinopril (PRINIVIL,ZESTRIL) 10 MG tablet Take 10 mg by mouth daily.    [provider]  metoprolol tartrate (LOPRESSOR) 25 MG tablet Take 25 mg by mouth 2 (two) times daily. 03/19/22   [provider]  NITROSTAT 0.4 MG SL tablet  08/16/14   [provider]  pravastatin (PRAVACHOL) 40 MG tablet TAKE 1 TABLET EVERY EVENING. 10/30/21   Strader, [place] M, PA-C  triamcinolone (KENALOG) 0.1 % paste SMARTSIG:TO TEETH 01/22/22   [provider]  Turmeric (QC TUMERIC COMPLEX) 500 MG CAPS Take 500 mg by mouth daily.    [provider]  valACYclovir (VALTREX) 500 MG tablet Take 500 mg by mouth daily.    [provider]      Allergies    Lipitor [atorvastatin], Crestor [rosuvastatin calcium], Zetia [ezetimibe], and Penicillins    Review of Systems   Review of Systems  Respiratory:  Positive for shortness of breath.     Physical Exam Updated Vital Signs BP 132/73   Pulse 69   Temp 98.1 F (36.7 C) (Oral)   Resp 19   Ht 6' 1"$  (1.854 m)   Wt 68.9 kg   SpO2 99%   BMI 20.05 kg/m  Physical Exam  ED Results / Procedures / Treatments    Labs (all labs ordered are listed, but only abnormal results are displayed) Labs Reviewed  BASIC METABOLIC PANEL - Abnormal; Notable for the following components:      Result Value   Glucose, Bld 110 (*)    All other components within normal limits  CBC  TROPONIN I (HIGH SENSITIVITY)  TROPONIN I (HIGH SENSITIVITY)    EKG None  Radiology DG Chest 2 View  Result Date: 09/02/2022 CLINICAL DATA:  Shortness of breath EXAM: CHEST - 2 VIEW COMPARISON:  07/03/2017 FINDINGS: Hyperinflation with mild bronchitic changes. No acute airspace disease or effusion. Normal cardiomediastinal silhouette. No pneumothorax IMPRESSION: No active cardiopulmonary disease. Electronically Signed   By: Donavan Foil M.D.   On: 09/02/2022 18:53    Procedures Procedures  {Document cardiac monitor, telemetry assessment procedure when appropriate:1}  Medications Ordered in ED Medications - No data to display  ED Course/ Medical Decision Making/ A&P   {   Click here for ABCD2, HEART and other calculatorsREFRESH Note before signing :1}                          Medical Decision Making  ***  {Document critical care time when appropriate:1} {Document review of labs and clinical decision tools ie heart  score, Chads2Vasc2 etc:1}  {Document your independent review of radiology images, and any outside records:1} {Document your discussion with family members, caretakers, and with consultants:1} {Document social determinants of health affecting pt's care:1} {Document your decision making why or why not admission, treatments were needed:1} Final Clinical Impression(s) / ED Diagnoses Final diagnoses:  None    Rx / DC Orders ED Discharge Orders     None

## 2022-09-02 NOTE — ED Triage Notes (Signed)
Pt c/o SOB and chest pain x 2-3 months, worse with exacerbation. Worst today after walking 2-3 miles, usually walk 5 miles daily. Pt seen PCP yesterday, to see Cardiology next month. H/O heart attack 20 years ago.

## 2022-09-03 NOTE — Discharge Instructions (Addendum)
Your CT scan showed emphysema and scattered pulmonary micronodules.  You should get another CT scan in 12 months to reassess the nodules.  Talk to Dr. Willey Blade about this.  Continue to follow up with your other outpatient providers, including your cardiologist.  Return to the emergency department for any new or worsening symptoms of concern.

## 2022-10-10 ENCOUNTER — Ambulatory Visit: Payer: BC Managed Care – PPO | Attending: Internal Medicine | Admitting: Internal Medicine

## 2022-10-10 ENCOUNTER — Encounter: Payer: Self-pay | Admitting: Internal Medicine

## 2022-10-10 VITALS — BP 108/60 | HR 56 | Ht 73.0 in | Wt 149.0 lb

## 2022-10-10 DIAGNOSIS — I2510 Atherosclerotic heart disease of native coronary artery without angina pectoris: Secondary | ICD-10-CM

## 2022-10-10 NOTE — Patient Instructions (Signed)
Medication Instructions:  Your physician recommends that you continue on your current medications as directed. Please refer to the Current Medication list given to you today.   Labwork: None today  Testing/Procedures: None today  Follow-Up: January 2025  Any Other Special Instructions Will Be Listed Below (If Applicable).  If you need a refill on your cardiac medications before your next appointment, please call your pharmacy.

## 2022-10-10 NOTE — Progress Notes (Signed)
Cardiology Office Note    Date:  10/10/2022   ID:  Justine, Halliday [birth year], MRN [id number]  PCP:  Asencion Noble, MD  Cardiologist: Previously followed by Dr. Bronson Ing --> Needs to switch to new MD  Patient presents for follow up of CAD     History of Present Illness:    Rodney Moore is a 62 y.o. male with past medical history of CAD (s/p DES to proximal LAD in 2004, low-risk NST in 11/2014), history of cardiomyopathy (EF previously 40-45% in 2013, normalized to 55-60% by repeat imaging in 08/2019), HTN and HLD who presents to the office today for annual follow-up.   The pt was seen by Court Joy in the past   LDL above goal but he was intolerant to Zetia, did not want to try Repatha.   Echo showed LVEF 55 to 60%   Akinesis fo the distal anterior septum;  mid/distal anterior wall and apex   were hypokinetic.     Since seen by B Strader in 2022 the pt denies Cp  Breathing is OK   Has occasional  "gas" sensation    Not associated with activity    He says he walks  5 miles EVERY day      No change in ability to do   No CP when runs   I saw the pt in clinic in March 2023    Since seen he denies CP    He says his breathing is unchanged    He runs sometimes to see if he gets more SOB   He has not      The pt was seen in ER in Feb 2024 with complaints of  mid thoracic back pain and also SOB Symtpoms started in November 2023   Pt says it gets worse with walking and bending   Can get at other times   In Feb it got worse, he got SOB   He says he got scared so he went to ER   Upset now that he hs $3000 bill and nothing found  Still with pains   PT notes a couple episodes of transient weakness in R arm    Brief  Minutes  Accompanied by R shoulder/neck discomfort   No other neuro complaints      Past Medical History:  Diagnosis Date   Atelectasis of right lung    history of   Coronary artery disease    GERD (gastroesophageal reflux disease)    History of nuclear stress test  09/12/2007   exercise myoview; mild perfusion defect in basal/mid inferior walls with mild defect reversibility; EKG negative for ischemia; low risk scan    Hypercholesteremia    Hypertension    Ischemic cardiomyopathy    echo 03/2012 EF 40-45%   Kidney stones    recurrent, every year since 1993   Myocardial infarction [facility]) 07/14/2002   [facility], [facility] Cardiology   Restless leg syndrome    Sleep apnea    STOP BANG score =4    Past Surgical History:  Procedure Laterality Date   CARDIAC CATHETERIZATION  2004   Cypher stent (3.0x54mm) to prox LAD    CATARACT EXTRACTION W/PHACO  03/18/2012   Procedure: CATARACT EXTRACTION PHACO AND INTRAOCULAR LENS PLACEMENT ([place]);  Surgeon: Tonny Branch, MD;  Location: [facility] ORS;  Service: Ophthalmology;  Laterality: Left;  CDE: 3.95   CHEST TUBE INSERTION  1991   [facility], [facility], [place]   CHOLECYSTECTOMY     COLONOSCOPY  04/05/2012   Procedure: COLONOSCOPY;  Surgeon: Daneil Dolin, MD;  Location: [facility] ENDO SUITE;  Service: Endoscopy;  Laterality: N/A;   COLONOSCOPY WITH PROPOFOL N/A 06/02/2022   Procedure: COLONOSCOPY WITH PROPOFOL;  Surgeon: Daneil Dolin, MD;  Location: [facility] ENDO SUITE;  Service: Endoscopy;  Laterality: N/A;  9:45 am   CYSTOSCOPY     TRANSTHORACIC ECHOCARDIOGRAM  03/30/2012   EF [id number]: LV systolic function moderately reduced; RA mildly dilated; mild MR; mod TR    Current Medications: Outpatient Medications Prior to Visit  Medication Sig Dispense Refill   ALPRAZolam (XANAX) 0.5 MG tablet 0.5 mg at bedtime.      aspirin EC 81 MG tablet Take 81 mg by mouth daily.     cetirizine (ZYRTEC) 10 MG tablet Take 10 mg by mouth daily.     Coenzyme Q10 (CO Q 10) 100 MG CAPS Take 100 mg by mouth daily.     lisinopril (PRINIVIL,ZESTRIL) 10 MG tablet Take 10 mg by mouth daily.     metoprolol tartrate (LOPRESSOR) 25 MG tablet Take 25 mg by mouth 2 (two) times daily.     NITROSTAT 0.4 MG SL tablet Place 0.4 mg under the tongue every 5 (five) minutes as  needed for chest pain.     pravastatin (PRAVACHOL) 40 MG tablet TAKE 1 TABLET EVERY EVENING. 90 tablet 3   triamcinolone (KENALOG) 0.1 % paste SMARTSIG:TO TEETH     Turmeric (QC TUMERIC COMPLEX) 500 MG CAPS Take 500 mg by mouth daily.     valACYclovir (VALTREX) 500 MG tablet Take 500 mg by mouth daily.     No facility-administered medications prior to visit.     Allergies:   Lipitor [atorvastatin], Crestor [rosuvastatin calcium], Zetia [ezetimibe], and Penicillins   Social History   Socioeconomic History   Marital status: Married    Spouse name: Not on file   Number of children: Not on file   Years of education: Not on file   Highest education level: Not on file  Occupational History    Employer: NOT EMPLOYED  Tobacco Use   Smoking status: Former    Packs/day: 1.50    Years: 40.00    Additional pack years: 0.00    Total pack years: 60.00    Types: Cigarettes    Quit date: 10/15/2006    Years since quitting: 15.9   Smokeless tobacco: Never  Vaping Use   Vaping Use: Never used  Substance and Sexual Activity   Alcohol use: Yes    Alcohol/week: 0.0 standard drinks of alcohol    Comment: occasional   Drug use: No   Sexual activity: Yes  Other Topics Concern   Not on file  Social History Narrative   Not on file   Social Determinants of Health   Financial Resource Strain: Not on file  Food Insecurity: Not on file  Transportation Needs: Not on file  Physical Activity: Not on file  Stress: Not on file  Social Connections: Not on file     Family History:  The patient's family history includes Hypertension in his father.   Review of Systems:   Please see the history of present illness.     All other systems reviewed and are otherwise negative except as noted above.   Physical Exam:    VS:  BP 108/60   Pulse (!) 56   Ht 6\' 1"  (1.854 m)   Wt 149 lb (67.6 kg)   SpO2 99%   BMI 19.66 kg/m  General:   Thin 62 yo appearing in no acute distress. Head:  Normocephalic, Neck: No carotid bruits. JVD normal  Lungs: CTA Heart: Regular rate and rhythm. No S3  No murmur, Abdomen: nontender   No Hepatomegaly     Extremities: No clubbing or cyanosis. No edema.  Distal pedal pulses are 2+ bilaterally.  Wt Readings from Last 3 Encounters:  10/10/22 149 lb (67.6 kg)  09/02/22 152 lb (68.9 kg)  06/02/22 155 lb (70.3 kg)      Studies/Labs Reviewed:   EKG:  EKG is ordered today. Not done today   Recent Labs: 09/02/2022: BUN 13; Creatinine, Ser 0.74; Hemoglobin 14.0; Platelets 182; Potassium 4.2; Sodium 137   Lipid Panel    Component Value Date/Time   CHOL 139 03/13/2017 0744   TRIG 73 03/13/2017 0744   HDL 37 (L) 03/13/2017 0744   LDLCALC 87 03/13/2017 0744    Additional studies/ records that were reviewed today include:   NST: 11/2014 There was no ST segment deviation noted during stress. The left ventricular ejection fraction is moderately decreased (30-44%). This is an overall low to intermediate risk study. Decreased LVEF suggests increased risk, however exercise capacity and Duke treadmill score support low risk Evidence of prior inferior and anterior wall infarct, no current myocardium at jeopardy    Echocardiogram: 08/2019 IMPRESSIONS     1. The mid to distal anteroseptal wall is akinetic. The mid to distal  anterior wall and apex are hypokinetic. [name] Left ventricular ejection  fraction, by estimation, is 55 to 60%. The left ventricle has normal  function. The left ventrical demonstrates  regional wall motion abnormalities (see scoring diagram/findings for  description). Left ventricular diastolic parameters were normal.   2. Right ventricular systolic function is normal. The right ventricular  size is normal.   3. The mitral valve is normal in structure and function. no evidence of  mitral valve regurgitation. No evidence of mitral stenosis.   4. The aortic valve is tricuspid. Aortic valve regurgitation is not   visualized. No aortic stenosis is present.   5. The inferior vena cava is dilated in size with >50% respiratory  variability, suggesting right atrial pressure of 8 mmHg.     Assessment:    No diagnosis found.     Plan:   In order of problems listed above:  1. CAD - He is s/p DES to proximal LAD in 2004 with a low-risk NST in 11/2014.  He denies CP   I do not think back pain is an anginal equivalent     2. History of Cardiomyopathy - His EF was previously reduced to 40-45% in 2013, normalized to 55-60% by repeat imaging in 08/2019. . Volume status is OK   K 3. HTN BP Well controlled  on current meds     4. HLD - He remains on Pravastatin 40 mg daily as he was previously intolerant to Atorvastatin, Crestor and Zetia. He is not interested in PCSK9   Continue  pravastatin  Reviewed diet   5  Arm numbness/ weakness   Accomp by shoulder discomfort  I told him to discuss with Dr Willey Blade   I do not think it is a TIA like event  SOund more neuromuscular   Medication Adjustments/Labs and Tests Ordered: Current medicines are reviewed at length with the patient today.  Concerns regarding medicines are outlined above.  Medication changes, Labs and Tests ordered today are listed in the Patient Instructions below. Patient Instructions  Medication Instructions:  Your physician recommends that you continue on your current medications as directed. Please refer to the Current Medication list given to you today.  *If you need a refill on your cardiac medications before your next appointment, please call your pharmacy*   Lab Work: NONE   If you have labs (blood work) drawn today and your tests are completely normal, you will receive your results only by: [facility] (if you have MyChart) OR A paper copy in the mail If you have any lab test that is abnormal or we need to change your treatment, we will call you to review the results.   Testing/Procedures: NONE    Follow-Up: At [facility], you and your health needs are our priority.  As part of our continuing mission to provide you with exceptional heart care, we have created designated Provider Care Teams.  These Care Teams include your primary Cardiologist (physician) and Advanced Practice Providers (APPs -  Physician Assistants and Nurse Practitioners) who all work together to provide you with the care you need, when you need it.  We recommend signing up for the patient portal called "MyChart".  Sign up information is provided on this After Visit Summary.  MyChart is used to connect with patients for Virtual Visits (Telemedicine).  Patients are able to view lab/test results, encounter notes, upcoming appointments, etc.  Non-urgent messages can be sent to your provider as well.   To learn more about what you can do with MyChart, go to [email or web address].    Your next appointment:   1 year(s)  The format for your next appointment:   In Person  Provider:   Bernerd Pho, PA-C/ MD    Other Instructions Thank you for choosing [facility]!    You can try taking Co-Q-10 to see if this helps with joint pain.     Signed, Dorris Carnes, MD  10/10/2022 9:09 AM    [facility]. [address] [place], [place] [address] Phone: [phone] Fax: [phone]

## 2023-02-25 DIAGNOSIS — I10 Essential (primary) hypertension: Secondary | ICD-10-CM | POA: Diagnosis not present

## 2023-02-25 DIAGNOSIS — I2510 Atherosclerotic heart disease of native coronary artery without angina pectoris: Secondary | ICD-10-CM | POA: Diagnosis not present

## 2023-02-25 DIAGNOSIS — Z79899 Other long term (current) drug therapy: Secondary | ICD-10-CM | POA: Diagnosis not present

## 2023-02-25 DIAGNOSIS — N400 Enlarged prostate without lower urinary tract symptoms: Secondary | ICD-10-CM | POA: Diagnosis not present

## 2023-02-25 DIAGNOSIS — Z125 Encounter for screening for malignant neoplasm of prostate: Secondary | ICD-10-CM | POA: Diagnosis not present

## 2023-03-04 DIAGNOSIS — I10 Essential (primary) hypertension: Secondary | ICD-10-CM | POA: Diagnosis not present

## 2023-03-04 DIAGNOSIS — I700 Atherosclerosis of aorta: Secondary | ICD-10-CM | POA: Diagnosis not present

## 2023-03-04 DIAGNOSIS — I2510 Atherosclerotic heart disease of native coronary artery without angina pectoris: Secondary | ICD-10-CM | POA: Diagnosis not present

## 2023-03-06 ENCOUNTER — Encounter: Payer: Self-pay | Admitting: Internal Medicine

## 2023-03-09 ENCOUNTER — Telehealth: Payer: Self-pay

## 2023-03-09 NOTE — Telephone Encounter (Signed)
-----   Message from Nurse Rudene Anda sent at 03/09/2023  8:52 AM EDT -----  ----- Message ----- From: Pricilla Riffle, MD Sent: 03/06/2023  12:52 PM EDT To: Bertram Millard, RN  LDL is 100    Given CAD would be better if LDL lower  Would he be interested in trying Nexletol 180 mg    ONe daily Follow up lipomed panel in 8 wks with liver panel

## 2023-03-09 NOTE — Telephone Encounter (Signed)
I spoke with patient and he does not want to take any additional medication for his cholesterol. He said he knows his numbers "aren't perfect" but he says it's been 20 years since his heart attack and he "must be doing something right"

## 2023-04-16 DIAGNOSIS — Z23 Encounter for immunization: Secondary | ICD-10-CM | POA: Diagnosis not present

## 2023-09-04 DIAGNOSIS — J430 Unilateral pulmonary emphysema [MacLeod's syndrome]: Secondary | ICD-10-CM | POA: Diagnosis not present

## 2023-09-04 DIAGNOSIS — I2510 Atherosclerotic heart disease of native coronary artery without angina pectoris: Secondary | ICD-10-CM | POA: Diagnosis not present

## 2023-09-04 DIAGNOSIS — R911 Solitary pulmonary nodule: Secondary | ICD-10-CM | POA: Diagnosis not present

## 2023-09-04 DIAGNOSIS — I10 Essential (primary) hypertension: Secondary | ICD-10-CM | POA: Diagnosis not present

## 2023-09-16 ENCOUNTER — Ambulatory Visit: Payer: BC Managed Care – PPO | Admitting: Internal Medicine

## 2023-10-20 ENCOUNTER — Ambulatory Visit: Payer: BC Managed Care – PPO | Admitting: Internal Medicine

## 2023-11-12 ENCOUNTER — Ambulatory Visit: Attending: Student | Admitting: Student

## 2023-11-12 ENCOUNTER — Encounter: Payer: Self-pay | Admitting: Student

## 2023-11-12 VITALS — BP 112/60 | HR 65 | Ht 73.0 in | Wt 153.0 lb

## 2023-11-12 DIAGNOSIS — E785 Hyperlipidemia, unspecified: Secondary | ICD-10-CM | POA: Diagnosis not present

## 2023-11-12 DIAGNOSIS — I2510 Atherosclerotic heart disease of native coronary artery without angina pectoris: Secondary | ICD-10-CM | POA: Diagnosis not present

## 2023-11-12 DIAGNOSIS — Z8679 Personal history of other diseases of the circulatory system: Secondary | ICD-10-CM

## 2023-11-12 DIAGNOSIS — I10 Essential (primary) hypertension: Secondary | ICD-10-CM | POA: Diagnosis not present

## 2023-11-12 NOTE — Progress Notes (Signed)
 Cardiology Office Note    Date:  11/12/2023  ID:  Rodney, Moore 31-Oct-1960, MRN [id number] Cardiologist: Ola Berger, MD    History of Present Illness:    Rodney Moore is a 63 y.o. male with past medical history of CAD (s/p DES to proximal LAD in 2004, low-risk NST in 11/2014), HFimpEF (EF previously 40-45% in 2013, normalized to 55-60% by repeat imaging in 08/2019), HTN and HLD who presents to the office today for annual follow-up.   He was last examined by Dr. Avanell Bob in 09/2022 and reported occasional episodes of right arm weakness but no specific anginal symptoms. He was continued on his current cardiac medications with ASA 81 mg daily, Lisinopril 10 mg daily, Lopressor 25 mg twice daily and Pravastatin  40 mg daily. Follow-up labs in 02/2023 showed his LDL was at 100 and Dr. Avanell Bob recommended trying Nexletol 180 mg daily but he declined at that time. Was previously intolerant to higher intensity statins and Zetia.  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 He denies any recent exertional chest pain or dyspnea on exertion with this. Was previously having right scapular pain last year but reports this resolved after bending down to pick up an object and he feels like it was due to a pinched nerve at that time. He denies any palpitations, orthopnea, PND or pitting edema. Does report occasional dizziness at times and says his SBP has been in the low 100's but this is infrequent.  Studies Reviewed:   EKG: EKG is ordered today and demonstrates:   EKG Interpretation Date/Time:  Thursday Nov 12 2023 12:55:54 EDT Ventricular Rate:  68 PR Interval:  144 QRS Duration:  80 QT Interval:  372 QTC Calculation: 395 R Axis:   63  Text Interpretation: Normal sinus rhythm Septal infarct pattern. No acute ST changes. Confirmed by Rodney Moore ([id number])  on 11/12/2023 12:59:16 PM       NST: 11/2014 There was no ST segment deviation noted during stress. The left ventricular ejection fraction is moderately decreased (30-44%). This is an overall low to intermediate risk study. Decreased LVEF suggests increased risk, however exercise capacity and Duke treadmill score support low risk Evidence of prior inferior and anterior wall infarct, no current myocardium at jeopardy  Echocardiogram: 08/2019 IMPRESSIONS     1. The mid to distal anteroseptal wall is akinetic. The mid to distal  anterior wall and apex are hypokinetic. Rodney Moore Left ventricular ejection  fraction, by estimation, is 55 to 60%. The left ventricle has normal  function. The left ventrical demonstrates  regional wall motion abnormalities (see scoring diagram/findings for  description). Left ventricular diastolic parameters were normal.   2. Right ventricular systolic function is normal. The right ventricular  size is normal.   3. The mitral valve is normal in structure and function. no evidence of  mitral valve regurgitation. No evidence of mitral stenosis.   4. The aortic valve is tricuspid. Aortic valve regurgitation is not  visualized. No aortic stenosis is present.   5. The inferior vena cava is dilated in size with >50% respiratory  variability, suggesting right atrial pressure of 8 mmHg.    Physical Exam:   VS:  BP 112/60 (BP Location: Right Arm, Cuff Size: Normal)   Pulse 65   Ht 6\' 1"  (1.854 m)   Wt 153  lb (69.4 kg)   SpO2 96%   BMI 20.19 kg/m    Wt Readings from Last 3 Encounters:  11/12/23 153 lb (69.4 kg)  10/10/22 149 lb (67.6 kg)  09/02/22 152 lb (68.9 kg)     GEN: Well nourished, well developed male appearing in no acute distress NECK: No JVD; No carotid bruits CARDIAC: RRR, no murmurs, rubs, gallops RESPIRATORY:  Clear to auscultation without rales, wheezing or rhonchi  ABDOMEN: Appears non-distended. No obvious abdominal masses. EXTREMITIES: No  clubbing or cyanosis. No pitting edema.  Distal pedal pulses are 2+ bilaterally.   Assessment and Plan:   1. CAD - He previously underwent DES placement to the proximal-LAD in 2004 and most recent ischemic evaluation was a low-risk NST in 11/2014. He continues to walk over 5 miles a day and denies any recent anginal symptoms with this. We reviewed planning for a follow-up NST next year given he will be 10 years out from his last ischemic evaluation. Would likely be able to perform a Treadmill Myoview at that time but would need to hold Metoprolol the evening before and morning of his test. Can order at the time of his next office visit though unless clinically indicated in the interim. - Continue current medical therapy with ASA 81 mg daily, Lopressor 25 mg twice daily and Pravastatin  40 mg daily.  2. History of HFimpEF - His EF was previously 40 to 45% in 2013 and had normalized to 55 to 60% by repeat imaging in 2021. He denies any recent respiratory issues and appears euvolemic by examination today. Continue Lisinopril 10 mg daily and Lopressor 25 mg twice daily.  3. HTN - BP is well-controlled at 112/60 during today's visit. Continue current medical therapy for now with Lisinopril 10 mg daily and Lopressor 25 mg twice daily. I encouraged him to make us  aware if he continues to have episodes of low BP at times as we may need to reduce Lisinopril to 5 mg daily.  4. HLD - His LDL was at 100 when checked in 02/2023. He was previously intolerant to higher intensity statins and also intolerant to Zetia. Was not interested in [facility] and has not been interested in injectable options such as PCSK9 inhibitor therapy or Leqvio. He wishes to continue Pravastatin  40 mg daily and focus on dietary changes. He has significantly reduced his intake of red meat and fried foods and was congratulated on this.  Signed, Dorma Gash, PA-C

## 2023-11-12 NOTE — Patient Instructions (Signed)
 Medication Instructions:   If you keep having dizziness and low blood pressures at times, let us  know and we can reduce Lisinopril.   *If you need a refill on your cardiac medications before your next appointment, please call your pharmacy*  Follow-Up: At [facility], you and your health needs are our priority.  As part of our continuing mission to provide you with exceptional heart care, our providers are all part of one team.  This team includes your primary Cardiologist (physician) and Advanced Practice Providers or APPs (Physician Assistants and Nurse Practitioners) who all work together to provide you with the care you need, when you need it.  Your next appointment:   1 year(s)  Provider:   You may see Ola Berger, MD or one of the following Advanced Practice Providers on your designated Care Team:   [name], PA-C  [place], [place] Theotis Flake, [place]     We recommend signing up for the patient portal called "MyChart".  Sign up information is provided on this After Visit Summary.  MyChart is used to connect with patients for Virtual Visits (Telemedicine).  Patients are able to view lab/test results, encounter notes, upcoming appointments, etc.  Non-urgent messages can be sent to your provider as well.   To learn more about what you can do with MyChart, go to [email or web address].

## 2024-02-25 DIAGNOSIS — N400 Enlarged prostate without lower urinary tract symptoms: Secondary | ICD-10-CM | POA: Diagnosis not present

## 2024-02-25 DIAGNOSIS — I2510 Atherosclerotic heart disease of native coronary artery without angina pectoris: Secondary | ICD-10-CM | POA: Diagnosis not present

## 2024-02-25 DIAGNOSIS — Z125 Encounter for screening for malignant neoplasm of prostate: Secondary | ICD-10-CM | POA: Diagnosis not present

## 2024-02-25 DIAGNOSIS — Z79899 Other long term (current) drug therapy: Secondary | ICD-10-CM | POA: Diagnosis not present

## 2024-02-25 DIAGNOSIS — I10 Essential (primary) hypertension: Secondary | ICD-10-CM | POA: Diagnosis not present

## 2024-03-03 DIAGNOSIS — I2510 Atherosclerotic heart disease of native coronary artery without angina pectoris: Secondary | ICD-10-CM | POA: Diagnosis not present

## 2024-03-03 DIAGNOSIS — Z23 Encounter for immunization: Secondary | ICD-10-CM | POA: Diagnosis not present

## 2024-03-03 DIAGNOSIS — J430 Unilateral pulmonary emphysema [MacLeod's syndrome]: Secondary | ICD-10-CM | POA: Diagnosis not present

## 2024-03-03 DIAGNOSIS — Z79899 Other long term (current) drug therapy: Secondary | ICD-10-CM | POA: Diagnosis not present

## 2024-03-03 DIAGNOSIS — I10 Essential (primary) hypertension: Secondary | ICD-10-CM | POA: Diagnosis not present

## 2024-03-03 DIAGNOSIS — E785 Hyperlipidemia, unspecified: Secondary | ICD-10-CM | POA: Diagnosis not present

## 2024-03-04 ENCOUNTER — Encounter: Payer: Self-pay | Admitting: Internal Medicine

## 2024-03-10 ENCOUNTER — Encounter: Payer: Self-pay | Admitting: Orthopaedic Surgery

## 2024-03-10 ENCOUNTER — Ambulatory Visit: Admitting: Orthopaedic Surgery

## 2024-03-10 ENCOUNTER — Other Ambulatory Visit: Payer: Self-pay

## 2024-03-10 VITALS — Wt 149.8 lb

## 2024-03-10 DIAGNOSIS — M25512 Pain in left shoulder: Secondary | ICD-10-CM | POA: Diagnosis not present

## 2024-03-10 DIAGNOSIS — G8929 Other chronic pain: Secondary | ICD-10-CM

## 2024-03-10 MED ORDER — NAPROXEN 500 MG PO TABS: 500.0000 mg | ORAL_TABLET | Freq: Two times a day (BID) | ORAL | 5 refills | Status: AC

## 2024-03-10 MED ORDER — METHYLPREDNISOLONE ACETATE 40 MG/ML IJ SUSP
40.0000 mg | Freq: Once | INTRAMUSCULAR | Status: AC
  Administered 2024-03-10: 40 mg via INTRA_ARTICULAR

## 2024-03-10 NOTE — Progress Notes (Signed)
 Subjective:    Patient ID: Rodney Moore, male    DOB: [birth year], 63 y.o.   MRN: [id number]  HPI About a month or six weeks ago while in bed, he reached over with his left hand to adjust the bedding and felt a pop then marked pain in the left shoulder.  The shoulder has been bothering him since then.  He has pain with overhead use and laying on it at night.  He has no swelling, no redness, no numbness.  Ice, heat, rubs and tylenol have not helped.  He had right shoulder pain years ago and got an injection that resolved that problem.  He thinks he needs a shot today.   Review of Systems  Constitutional:  Positive for activity change.  Musculoskeletal:  Positive for arthralgias and myalgias.  All other systems reviewed and are negative. For Review of Systems, all other systems reviewed and are negative.  The following is a summary of the past history medically, past history surgically, known current medicines, social history and family history.  This information is gathered electronically by the computer from prior information and documentation.  I review this each visit and have found including this information at this point in the chart is beneficial and informative.   Past Medical History:  Diagnosis Date   Atelectasis of right lung    history of   Coronary artery disease    GERD (gastroesophageal reflux disease)    History of nuclear stress test 09/12/2007   exercise myoview; mild perfusion defect in basal/mid inferior walls with mild defect reversibility; EKG negative for ischemia; low risk scan    Hypercholesteremia    Hypertension    Ischemic cardiomyopathy    echo 03/2012 EF 40-45%   Kidney stones    recurrent, every year since 1993   Myocardial infarction [facility]) 07/14/2002   [facility]   Restless leg syndrome    Sleep apnea    STOP BANG score =4    Past Surgical History:  Procedure Laterality Date   CARDIAC CATHETERIZATION  2004   Cypher stent  (3.0x39mm) to prox LAD    CATARACT EXTRACTION W/PHACO  03/18/2012   Procedure: CATARACT EXTRACTION PHACO AND INTRAOCULAR LENS PLACEMENT (IOC);  Surgeon: Cherene Mania, MD;  Location: [facility];  Service: Ophthalmology;  Laterality: Left;  CDE: 3.95   CHEST TUBE INSERTION  1991   [facility], [facility], [place]   CHOLECYSTECTOMY     COLONOSCOPY  04/05/2012   Procedure: COLONOSCOPY;  Surgeon: Lamar CHRISTELLA Hollingshead, MD;  Location: [facility];  Service: Endoscopy;  Laterality: N/A;   COLONOSCOPY WITH PROPOFOL  N/A 06/02/2022   Procedure: COLONOSCOPY WITH PROPOFOL ;  Surgeon: Hollingshead Lamar CHRISTELLA, MD;  Location: [facility];  Service: Endoscopy;  Laterality: N/A;  9:45 am   CYSTOSCOPY     TRANSTHORACIC ECHOCARDIOGRAM  03/30/2012   EF 40-45%: LV systolic function moderately reduced; RA mildly dilated; mild MR; mod TR    Current Outpatient Medications on File Prior to Visit  Medication Sig Dispense Refill   ALPRAZolam (XANAX) 0.5 MG tablet 0.5 mg at bedtime.      aspirin EC 81 MG tablet Take 81 mg by mouth daily.     cetirizine (ZYRTEC) 10 MG tablet Take 10 mg by mouth daily.     lisinopril (PRINIVIL,ZESTRIL) 10 MG tablet Take 10 mg by mouth daily.     metoprolol tartrate (LOPRESSOR) 25 MG tablet Take 25 mg by mouth 2 (two) times daily.  NITROSTAT 0.4 MG SL tablet Place 0.4 mg under the tongue every 5 (five) minutes as needed for chest pain.     pravastatin  (PRAVACHOL ) 40 MG tablet TAKE 1 TABLET EVERY EVENING. 90 tablet 3   triamcinolone (KENALOG) 0.1 % paste SMARTSIG:TO TEETH     Turmeric (QC TUMERIC COMPLEX) 500 MG CAPS Take 500 mg by mouth daily.     valACYclovir (VALTREX) 500 MG tablet Take 500 mg by mouth daily.     No current facility-administered medications on file prior to visit.    Social History   Socioeconomic History   Marital status: Married    Spouse name: Not on file   Number of children: Not on file   Years of education: Not on file   Highest education level: Not on file  Occupational History     Employer: NOT EMPLOYED  Tobacco Use   Smoking status: Former    Current packs/day: 0.00    Average packs/day: 1.5 packs/day for 40.0 years (60.0 ttl pk-yrs)    Types: Cigarettes    Start date: 10/15/1966    Quit date: 10/15/2006    Years since quitting: 17.4   Smokeless tobacco: Never  Vaping Use   Vaping status: Never Used  Substance and Sexual Activity   Alcohol use: Not Currently    Comment: occasional   Drug use: No   Sexual activity: Yes  Other Topics Concern   Not on file  Social History Narrative   Not on file   Social Drivers of Health   Financial Resource Strain: Not on file  Food Insecurity: Not on file  Transportation Needs: Not on file  Physical Activity: Not on file  Stress: Not on file  Social Connections: Not on file  Intimate Partner Violence: Not on file    Family History  Problem Relation Age of Onset   Hypertension Father     Wt 149 lb 12.8 oz (67.9 kg)   BMI 19.76 kg/m   Body mass index is 19.76 kg/m.      Objective:   Physical Exam Vitals and nursing note reviewed. Exam conducted with a chaperone present.  Constitutional:      Appearance: He is well-developed.  HENT:     Head: Normocephalic and atraumatic.  Eyes:     Conjunctiva/sclera: Conjunctivae normal.     Pupils: Pupils are equal, round, and reactive to light.  Cardiovascular:     Rate and Rhythm: Normal rate and regular rhythm.  Pulmonary:     Effort: Pulmonary effort is normal.  Abdominal:     Palpations: Abdomen is soft.  Musculoskeletal:       Arms:     Cervical back: Normal range of motion and neck supple.  Skin:    General: Skin is warm and dry.  Neurological:     Mental Status: He is alert and oriented to person, place, and time.     Cranial Nerves: No cranial nerve deficit.     Motor: No abnormal muscle tone.     Coordination: Coordination normal.     Deep Tendon Reflexes: Reflexes are normal and symmetric. Reflexes normal.  Psychiatric:        Behavior:  Behavior normal.        Thought Content: Thought content normal.        Judgment: Judgment normal.   X-rays were done of the left shoulder, reported separately.        Assessment & Plan:   Encounter Diagnosis  Name Primary?  Chronic left shoulder pain Yes   I am concerned about a possible rotator cuff tear.  PROCEDURE NOTE:  The patient request injection, verbal consent was obtained.  The left shoulder was prepped appropriately after time out was performed.   Sterile technique was observed and injection of 1 cc of DepoMedrol 40mg  with several cc's of plain xylocaine . Anesthesia was provided by ethyl chloride and a 20-gauge needle was used to inject the shoulder area. A posterior approach was used.  The injection was tolerated well.  A band aid dressing was applied.  The patient was advised to apply ice later today and tomorrow to the injection sight as needed.  I will begin Naprosyn  500 po bid pc.  Return in two weeks.  Call if any problem.  Precautions discussed.  Electronically Signed Lemond Stable, MD 8/28/20253:21 PM

## 2024-03-10 NOTE — Addendum Note (Signed)
 Addended by: MARCINE HUSBAND T on: 03/10/2024 04:36 PM   Modules accepted: Orders

## 2024-03-23 ENCOUNTER — Ambulatory Visit: Admitting: Orthopaedic Surgery

## 2024-03-23 ENCOUNTER — Encounter: Payer: Self-pay | Admitting: Orthopaedic Surgery

## 2024-03-23 DIAGNOSIS — G8929 Other chronic pain: Secondary | ICD-10-CM

## 2024-03-23 DIAGNOSIS — M25512 Pain in left shoulder: Secondary | ICD-10-CM | POA: Diagnosis not present

## 2024-03-23 MED ORDER — METHYLPREDNISOLONE ACETATE 40 MG/ML IJ SUSP
40.0000 mg | Freq: Once | INTRAMUSCULAR | Status: AC
  Administered 2024-03-23: 40 mg via INTRA_ARTICULAR

## 2024-03-23 NOTE — Progress Notes (Signed)
 PROCEDURE NOTE:  The patient request injection, verbal consent was obtained.  The left shoulder was prepped appropriately after time out was performed.   Sterile technique was observed and injection of 1 cc of DepoMedrol 40mg  with several cc's of plain xylocaine . Anesthesia was provided by ethyl chloride and a 20-gauge needle was used to inject the shoulder area. A posterior approach was used.  The injection was tolerated well.  A band aid dressing was applied.  The patient was advised to apply ice later today and tomorrow to the injection sight as needed.  Encounter Diagnosis  Name Primary?   Chronic left shoulder pain Yes   I have informed the patient I will be retiring from medical practice and from this office on April 14, 2024.  The patient has been offered continuing care with Dr. Margrette or Dr. Onesimo of this office.  The patient may choose another provider and the records will be forwarded after proper signature and notification.  Patient understands and agrees.  He would like to see Dr. Onesimo.  Return in one month.  I recommended consideration of MRI but he has a high deductible and defers for now.  Call if any problem.  Precautions discussed.  Electronically Signed Lemond Stable, MD 9/10/20258:31 AM

## 2024-03-23 NOTE — Addendum Note (Signed)
 Addended by: MARCINE HUSBAND T on: 03/23/2024 10:24 AM   Modules accepted: Orders

## 2024-04-18 DIAGNOSIS — Z23 Encounter for immunization: Secondary | ICD-10-CM | POA: Diagnosis not present

## 2024-04-20 ENCOUNTER — Ambulatory Visit: Admitting: Orthopedic Surgery

## 2024-05-18 ENCOUNTER — Ambulatory Visit: Admitting: Orthopedic Surgery

## 2024-07-19 ENCOUNTER — Ambulatory Visit: Admitting: Orthopedic Surgery

## 2024-11-17 ENCOUNTER — Ambulatory Visit: Admitting: Student
